# Patient Record
Sex: Female | Born: 1970 | Race: White | Hispanic: No | Marital: Married | Smoking: Current every day smoker
Health system: Southern US, Community
[De-identification: ages and names within clinical notes are randomized; demographics above are authoritative.]

## PROBLEM LIST (undated history)

## (undated) DIAGNOSIS — C801 Malignant (primary) neoplasm, unspecified: Secondary | ICD-10-CM

## (undated) DIAGNOSIS — I10 Essential (primary) hypertension: Secondary | ICD-10-CM

## (undated) DIAGNOSIS — C539 Malignant neoplasm of cervix uteri, unspecified: Secondary | ICD-10-CM

## (undated) HISTORY — DX: Essential (primary) hypertension: I10

## (undated) HISTORY — PX: ABDOMINAL HYSTERECTOMY: SHX81

## (undated) HISTORY — PX: DILATION AND CURETTAGE OF UTERUS: SHX78

---

## 1999-01-30 ENCOUNTER — Other Ambulatory Visit: Admission: RE | Admit: 1999-01-30 | Discharge: 1999-01-30 | Payer: Self-pay | Admitting: Gynecology

## 1999-06-12 ENCOUNTER — Encounter: Admission: RE | Admit: 1999-06-12 | Discharge: 1999-09-10 | Payer: Self-pay | Admitting: Gynecology

## 1999-06-19 ENCOUNTER — Inpatient Hospital Stay (HOSPITAL_COMMUNITY): Admission: AD | Admit: 1999-06-19 | Discharge: 1999-06-19 | Payer: Self-pay | Admitting: Obstetrics and Gynecology

## 1999-07-22 ENCOUNTER — Inpatient Hospital Stay (HOSPITAL_COMMUNITY): Admission: AD | Admit: 1999-07-22 | Discharge: 1999-07-22 | Payer: Self-pay | Admitting: Gynecology

## 1999-07-27 ENCOUNTER — Inpatient Hospital Stay (HOSPITAL_COMMUNITY): Admission: AD | Admit: 1999-07-27 | Discharge: 1999-07-27 | Payer: Self-pay | Admitting: Gynecology

## 1999-08-19 ENCOUNTER — Inpatient Hospital Stay (HOSPITAL_COMMUNITY): Admission: AD | Admit: 1999-08-19 | Discharge: 1999-08-21 | Payer: Self-pay | Admitting: Gynecology

## 1999-08-19 ENCOUNTER — Encounter (INDEPENDENT_AMBULATORY_CARE_PROVIDER_SITE_OTHER): Payer: Self-pay

## 1999-09-27 ENCOUNTER — Other Ambulatory Visit: Admission: RE | Admit: 1999-09-27 | Discharge: 1999-09-27 | Payer: Self-pay | Admitting: Gynecology

## 2000-01-28 ENCOUNTER — Encounter: Payer: Self-pay | Admitting: Emergency Medicine

## 2000-01-28 ENCOUNTER — Emergency Department (HOSPITAL_COMMUNITY): Admission: EM | Admit: 2000-01-28 | Discharge: 2000-01-28 | Payer: Self-pay | Admitting: Emergency Medicine

## 2000-04-28 ENCOUNTER — Inpatient Hospital Stay (HOSPITAL_COMMUNITY): Admission: EM | Admit: 2000-04-28 | Discharge: 2000-04-29 | Payer: Self-pay | Admitting: *Deleted

## 2000-05-04 ENCOUNTER — Inpatient Hospital Stay (HOSPITAL_COMMUNITY): Admission: EM | Admit: 2000-05-04 | Discharge: 2000-05-06 | Payer: Self-pay | Admitting: *Deleted

## 2000-09-03 ENCOUNTER — Emergency Department (HOSPITAL_COMMUNITY): Admission: EM | Admit: 2000-09-03 | Discharge: 2000-09-03 | Payer: Self-pay | Admitting: Emergency Medicine

## 2000-10-29 ENCOUNTER — Other Ambulatory Visit: Admission: RE | Admit: 2000-10-29 | Discharge: 2000-10-29 | Payer: Self-pay | Admitting: Gynecology

## 2000-12-26 ENCOUNTER — Emergency Department (HOSPITAL_COMMUNITY): Admission: EM | Admit: 2000-12-26 | Discharge: 2000-12-26 | Payer: Self-pay | Admitting: Emergency Medicine

## 2000-12-26 ENCOUNTER — Encounter: Payer: Self-pay | Admitting: Emergency Medicine

## 2001-03-27 ENCOUNTER — Emergency Department (HOSPITAL_COMMUNITY): Admission: EM | Admit: 2001-03-27 | Discharge: 2001-03-27 | Payer: Self-pay | Admitting: Emergency Medicine

## 2001-03-28 ENCOUNTER — Emergency Department (HOSPITAL_COMMUNITY): Admission: EM | Admit: 2001-03-28 | Discharge: 2001-03-28 | Payer: Self-pay | Admitting: Emergency Medicine

## 2001-11-26 ENCOUNTER — Ambulatory Visit (HOSPITAL_COMMUNITY): Admission: RE | Admit: 2001-11-26 | Discharge: 2001-11-26 | Payer: Self-pay | Admitting: Internal Medicine

## 2001-11-26 ENCOUNTER — Encounter: Payer: Self-pay | Admitting: Internal Medicine

## 2003-01-23 ENCOUNTER — Other Ambulatory Visit: Admission: RE | Admit: 2003-01-23 | Discharge: 2003-01-23 | Payer: Self-pay | Admitting: Obstetrics & Gynecology

## 2003-01-23 ENCOUNTER — Other Ambulatory Visit: Admission: RE | Admit: 2003-01-23 | Discharge: 2003-01-23 | Payer: Self-pay | Admitting: Obstetrics and Gynecology

## 2004-01-09 ENCOUNTER — Emergency Department (HOSPITAL_COMMUNITY): Admission: EM | Admit: 2004-01-09 | Discharge: 2004-01-09 | Payer: Self-pay | Admitting: Emergency Medicine

## 2004-01-19 ENCOUNTER — Ambulatory Visit (HOSPITAL_COMMUNITY): Admission: RE | Admit: 2004-01-19 | Discharge: 2004-01-19 | Payer: Self-pay | Admitting: *Deleted

## 2004-02-22 ENCOUNTER — Other Ambulatory Visit: Admission: RE | Admit: 2004-02-22 | Discharge: 2004-02-22 | Payer: Self-pay | Admitting: *Deleted

## 2004-03-05 ENCOUNTER — Other Ambulatory Visit: Admission: RE | Admit: 2004-03-05 | Discharge: 2004-03-05 | Payer: Self-pay | Admitting: *Deleted

## 2004-04-02 ENCOUNTER — Ambulatory Visit: Admission: RE | Admit: 2004-04-02 | Discharge: 2004-04-02 | Payer: Self-pay | Admitting: Gynecology

## 2004-04-09 ENCOUNTER — Encounter (INDEPENDENT_AMBULATORY_CARE_PROVIDER_SITE_OTHER): Payer: Self-pay | Admitting: *Deleted

## 2004-04-09 ENCOUNTER — Ambulatory Visit (HOSPITAL_COMMUNITY): Admission: RE | Admit: 2004-04-09 | Discharge: 2004-04-09 | Payer: Self-pay | Admitting: Gynecology

## 2004-05-21 ENCOUNTER — Ambulatory Visit: Admission: RE | Admit: 2004-05-21 | Discharge: 2004-05-21 | Payer: Self-pay | Admitting: Gynecology

## 2004-07-30 ENCOUNTER — Encounter (INDEPENDENT_AMBULATORY_CARE_PROVIDER_SITE_OTHER): Payer: Self-pay | Admitting: *Deleted

## 2004-07-30 ENCOUNTER — Ambulatory Visit: Admission: RE | Admit: 2004-07-30 | Discharge: 2004-07-30 | Payer: Self-pay | Admitting: Gynecologic Oncology

## 2004-08-17 ENCOUNTER — Emergency Department (HOSPITAL_COMMUNITY): Admission: EM | Admit: 2004-08-17 | Discharge: 2004-08-17 | Payer: Self-pay | Admitting: Emergency Medicine

## 2004-08-19 ENCOUNTER — Inpatient Hospital Stay (HOSPITAL_COMMUNITY): Admission: AD | Admit: 2004-08-19 | Discharge: 2004-08-23 | Payer: Self-pay | Admitting: *Deleted

## 2004-09-18 ENCOUNTER — Ambulatory Visit (HOSPITAL_COMMUNITY): Admission: RE | Admit: 2004-09-18 | Discharge: 2004-09-18 | Payer: Self-pay | Admitting: *Deleted

## 2004-10-08 ENCOUNTER — Ambulatory Visit (HOSPITAL_COMMUNITY): Admission: AD | Admit: 2004-10-08 | Discharge: 2004-10-08 | Payer: Self-pay | Admitting: *Deleted

## 2004-11-01 ENCOUNTER — Ambulatory Visit (HOSPITAL_COMMUNITY): Admission: RE | Admit: 2004-11-01 | Discharge: 2004-11-01 | Payer: Self-pay | Admitting: *Deleted

## 2004-12-11 ENCOUNTER — Ambulatory Visit (HOSPITAL_COMMUNITY): Admission: AD | Admit: 2004-12-11 | Discharge: 2004-12-11 | Payer: Self-pay | Admitting: *Deleted

## 2004-12-16 ENCOUNTER — Ambulatory Visit (HOSPITAL_COMMUNITY): Admission: AD | Admit: 2004-12-16 | Discharge: 2004-12-16 | Payer: Self-pay | Admitting: *Deleted

## 2004-12-17 ENCOUNTER — Ambulatory Visit (HOSPITAL_COMMUNITY): Admission: RE | Admit: 2004-12-17 | Discharge: 2004-12-17 | Payer: Self-pay | Admitting: *Deleted

## 2004-12-19 ENCOUNTER — Ambulatory Visit (HOSPITAL_COMMUNITY): Admission: AD | Admit: 2004-12-19 | Discharge: 2004-12-19 | Payer: Self-pay | Admitting: *Deleted

## 2004-12-23 ENCOUNTER — Ambulatory Visit (HOSPITAL_COMMUNITY): Admission: AD | Admit: 2004-12-23 | Discharge: 2004-12-23 | Payer: Self-pay | Admitting: *Deleted

## 2004-12-26 ENCOUNTER — Ambulatory Visit (HOSPITAL_COMMUNITY): Admission: AD | Admit: 2004-12-26 | Discharge: 2004-12-26 | Payer: Self-pay | Admitting: *Deleted

## 2004-12-29 ENCOUNTER — Ambulatory Visit (HOSPITAL_COMMUNITY): Admission: AD | Admit: 2004-12-29 | Discharge: 2004-12-29 | Payer: Self-pay | Admitting: *Deleted

## 2005-02-18 ENCOUNTER — Ambulatory Visit: Admission: RE | Admit: 2005-02-18 | Discharge: 2005-02-18 | Payer: Self-pay | Admitting: Gynecology

## 2005-03-18 ENCOUNTER — Encounter (INDEPENDENT_AMBULATORY_CARE_PROVIDER_SITE_OTHER): Payer: Self-pay | Admitting: Specialist

## 2005-03-18 ENCOUNTER — Inpatient Hospital Stay (HOSPITAL_COMMUNITY): Admission: RE | Admit: 2005-03-18 | Discharge: 2005-03-20 | Payer: Self-pay | Admitting: Obstetrics & Gynecology

## 2005-05-16 ENCOUNTER — Ambulatory Visit: Admission: RE | Admit: 2005-05-16 | Discharge: 2005-05-16 | Payer: Self-pay | Admitting: Gynecology

## 2006-02-03 IMAGING — US US OB COMP LESS 14 WK
1 series · 13 of 28 positions shown · non-contrast
Comparison: No prior studies for comparison.

CLINICAL DATA: Patient is 9 weeks 4 days pregnant based on LMP of 11/03/03.  She has had vaginal bleeding.
 1.  TRANSABDOMINAL OBSTETRIC ULTRASOUND, LESS THAN 14 WEEKS GESTATION:
 2.  TRANSVAGINAL OBSTETRIC ULTRASOUND:

[Series 1: unknown · 0.29mm/px · 13 of 51 slices shown]
[im 2/51]
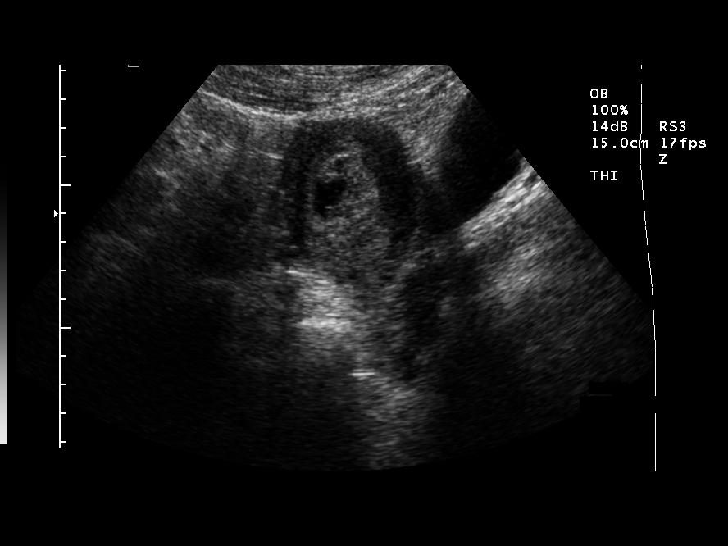
[im 6/51]
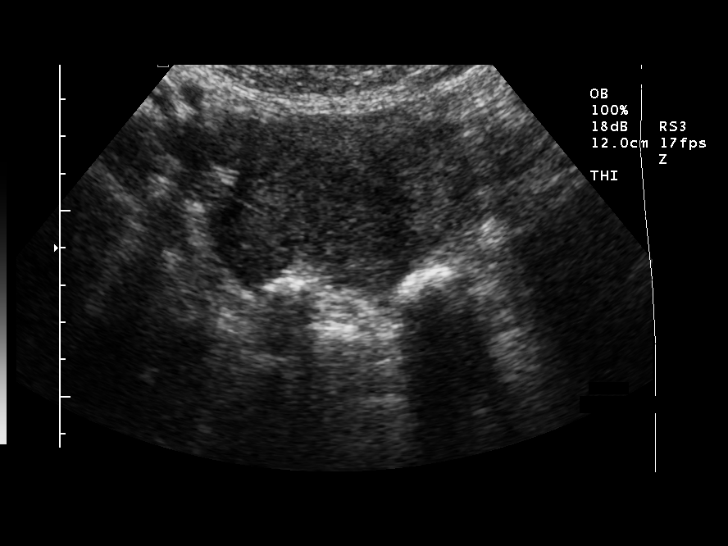
[im 10/51]
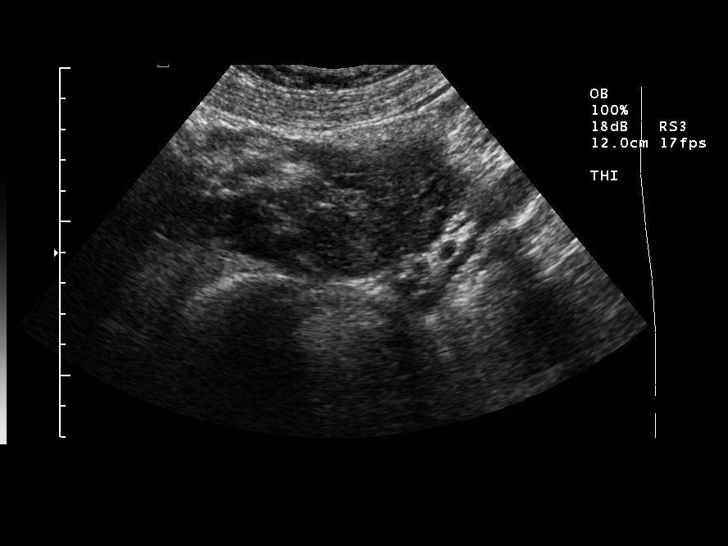
[im 13/51]
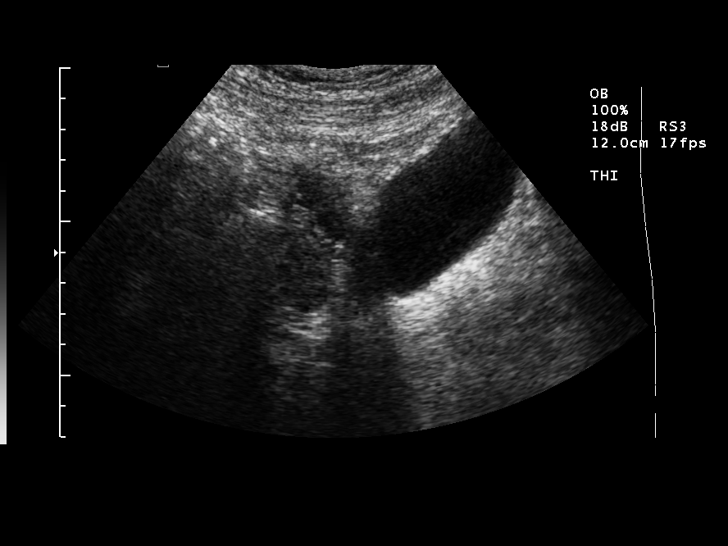
[im 17/51]
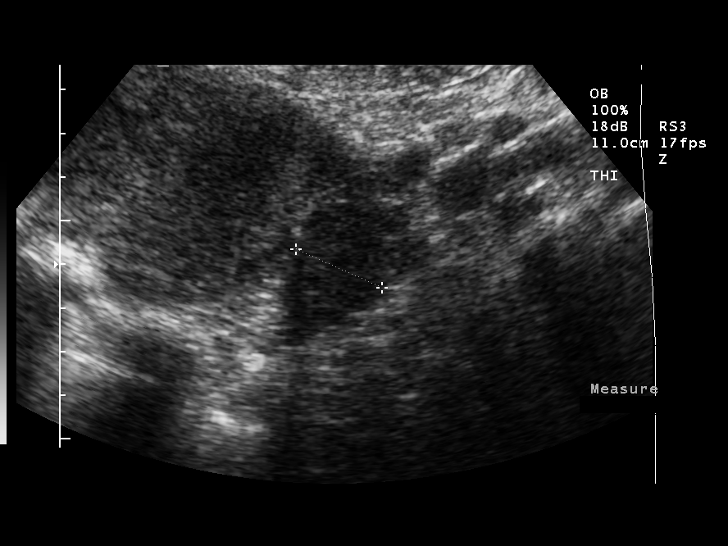
[im 21/51]
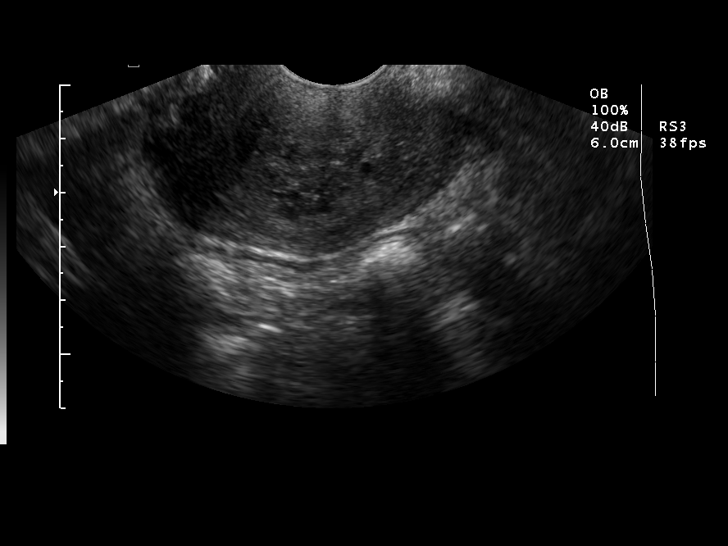
[im 26/51]
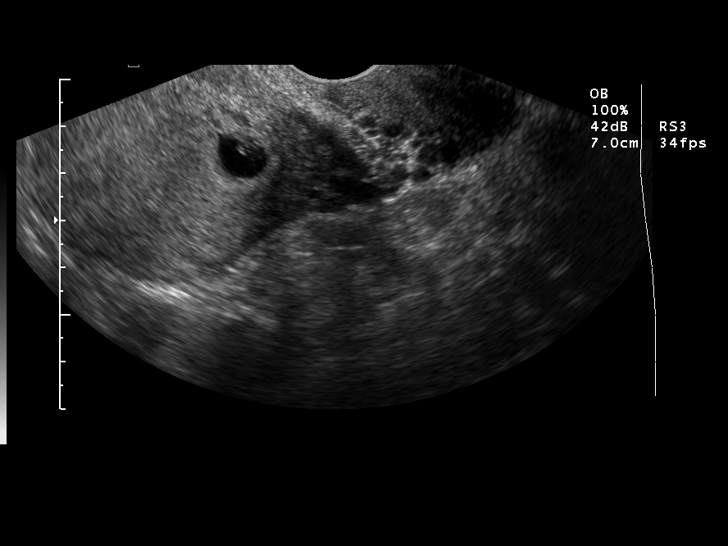
[im 30/51]
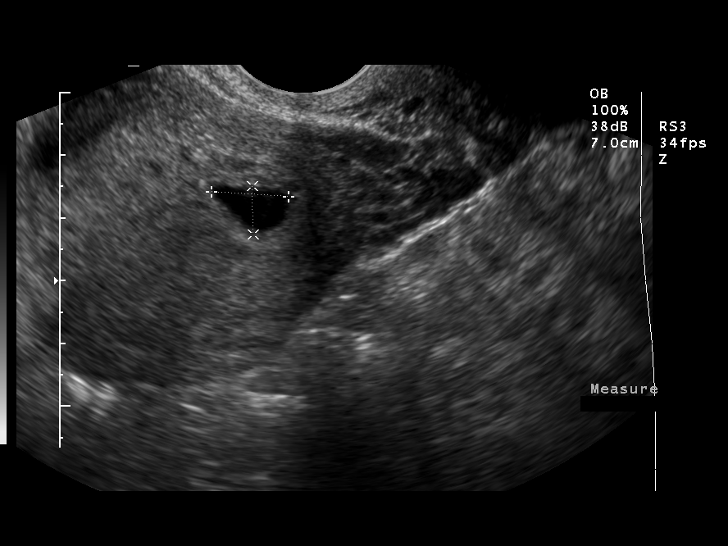
[im 34/51]
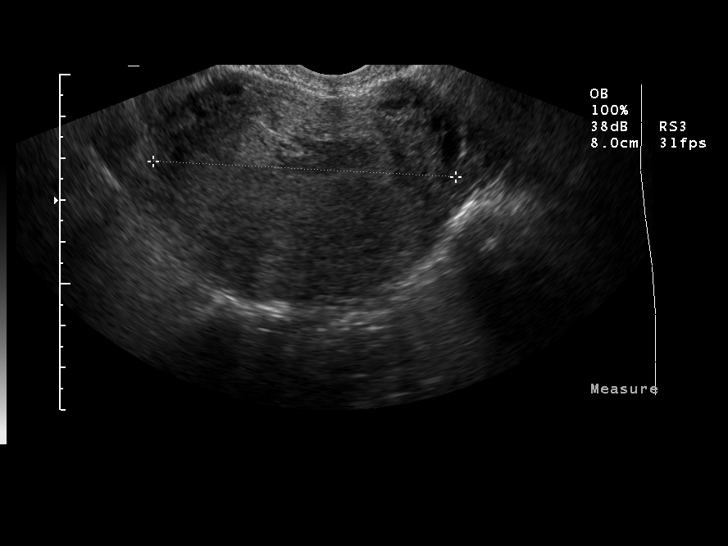
[im 38/51]
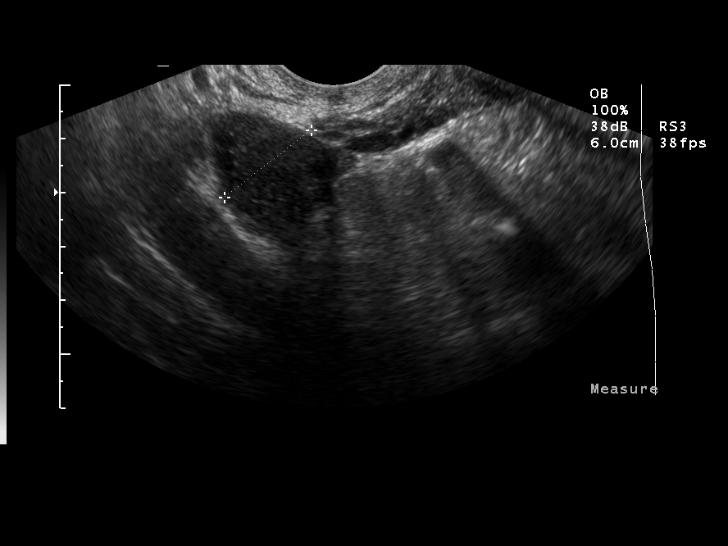
[im 41/51]
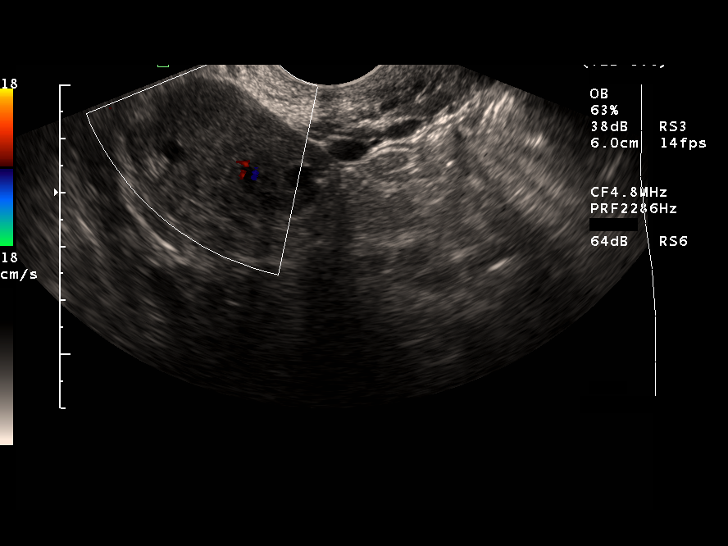
[im 45/51]
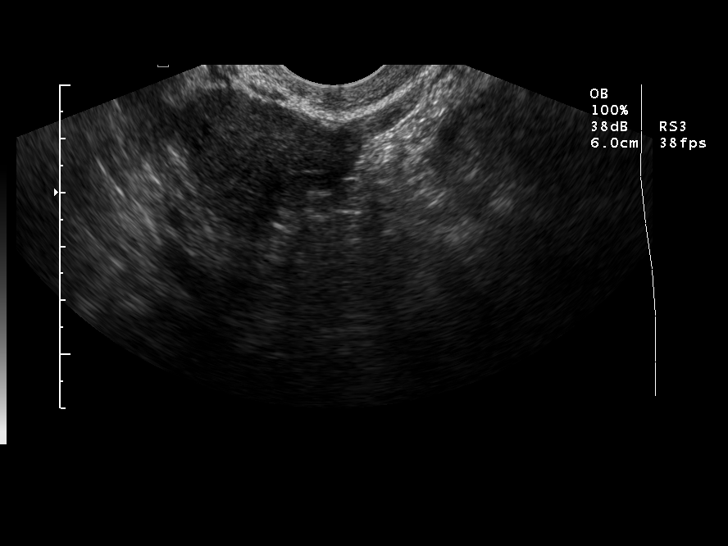
[im 49/51]
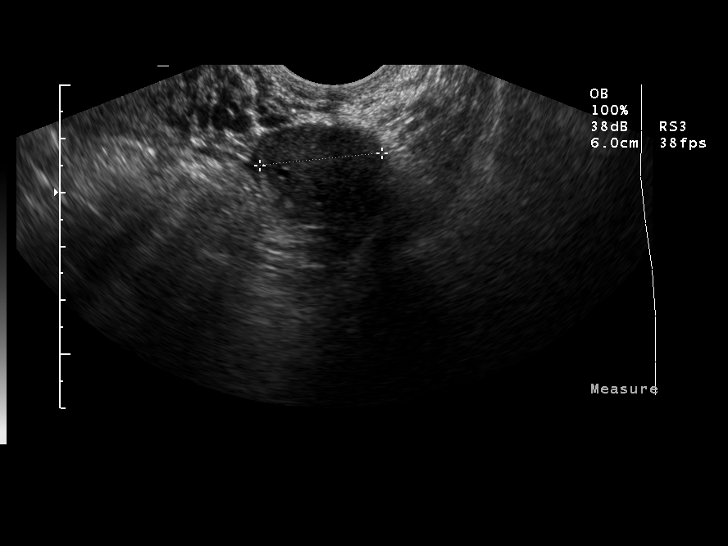

[13 of 28 positions shown; findings below may reference images not displayed]

TRANSABDOMINAL OB ULTRASOUND:
 The uterus contains an ovoid gestational sac.  Both adnexal regions are well visualized on the transabdominal study and demonstrate no abnormalities.  No free fluid was seen on the transabdominal study.
 TRANSVAGINAL OBSTETRIC SONOGRAPHY:
 Uterine dimensions are approximately 9.5 x 4.9 x 7.2 cm.  There is an irregular shaped gestational sac present in the uterus.  Based on estimated mean sac diameter, gestational age by ultrasound is estimated to be 6 weeks and 1 day.  There is some debris along the margin of the sac without a defined fetal pole or visible yolk sac.  No heart rate is detected.  
 The right ovary measures 3.6 x 2 x 2 cm and has a normal appearance.  The left ovary measures 2.3 x 3.1 x 2.3 cm and is normal.  No free fluid is seen.
IMPRESSION: Irregular gestational sac without defined fetal pole or yolk sac.  No fetal heart rate is detected.  Mean sac diameter corresponds to a 6 week gestation.  This is likely not viable.  The ovaries are normal.

## 2007-01-12 IMAGING — US US OB FOLLOW-UP
1 series · 13 of 28 positions shown · non-contrast
Comparison: 11/01/04.

CLINICAL DATA: Twin pregnancy.
 TWIN OBSTETRICAL ULTRASOUND RE-EVALUATION

 A living intrauterine twin gestation is present.  A separating membrane is seen.

[Series 1: unknown · 0.32mm/px · 13 of 32 slices shown]
[im 2/32]
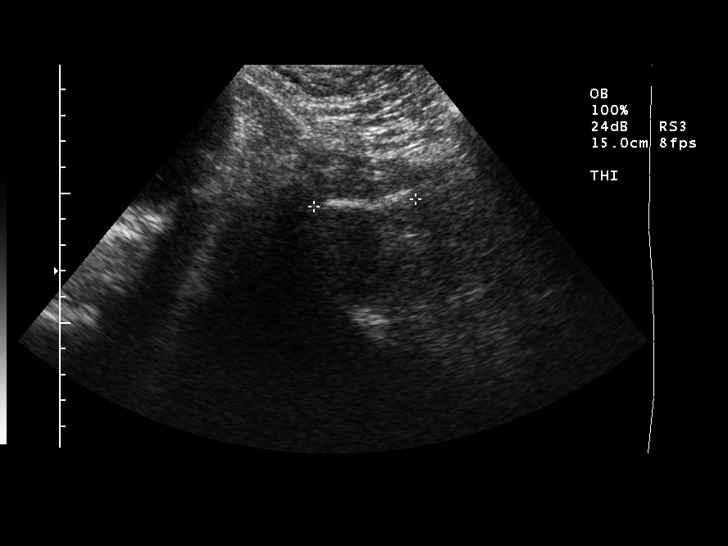
[im 4/32]
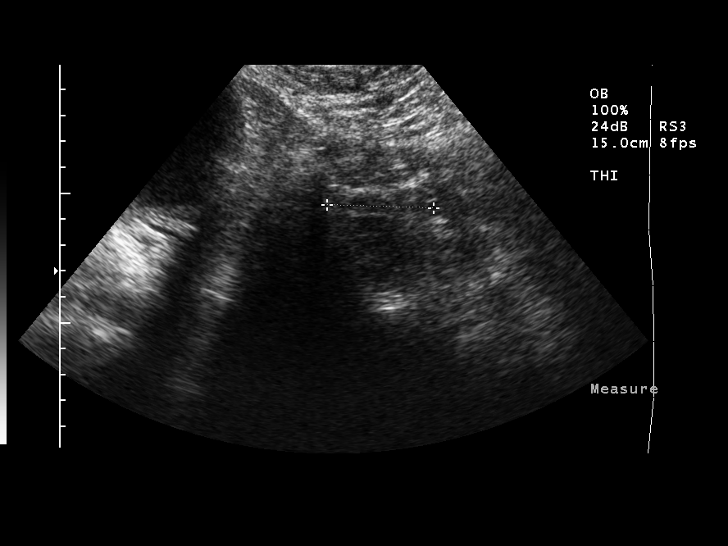
[im 6/32]
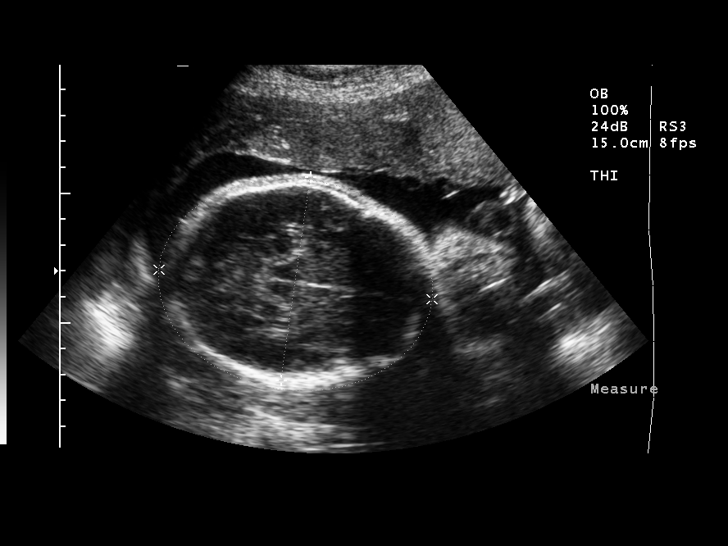
[im 9/32]
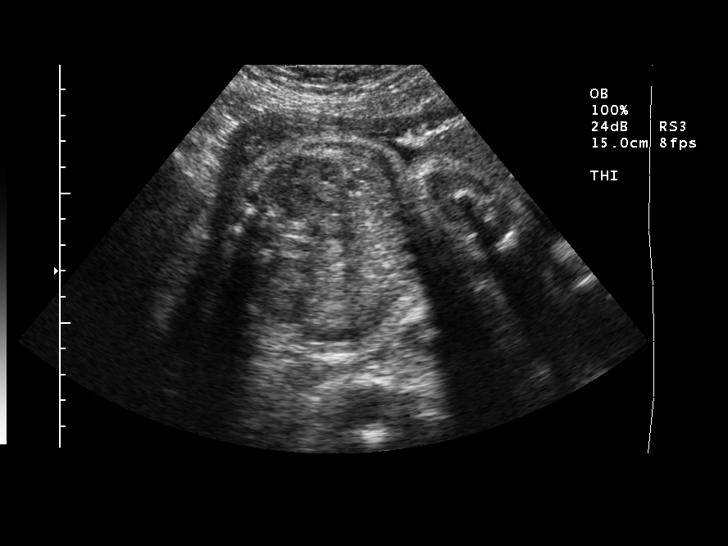
[im 11/32]
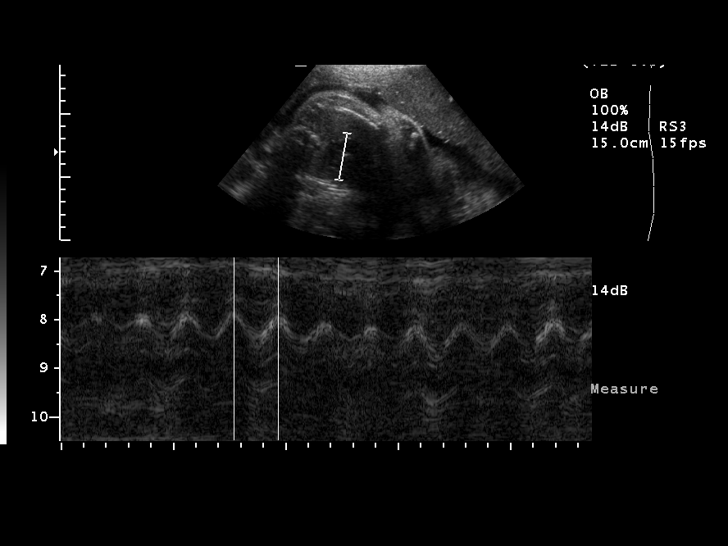
[im 13/32]
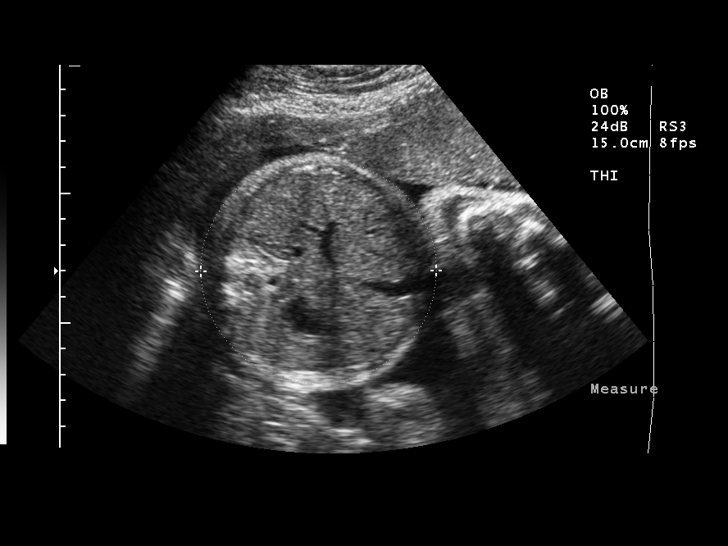
[im 17/32]
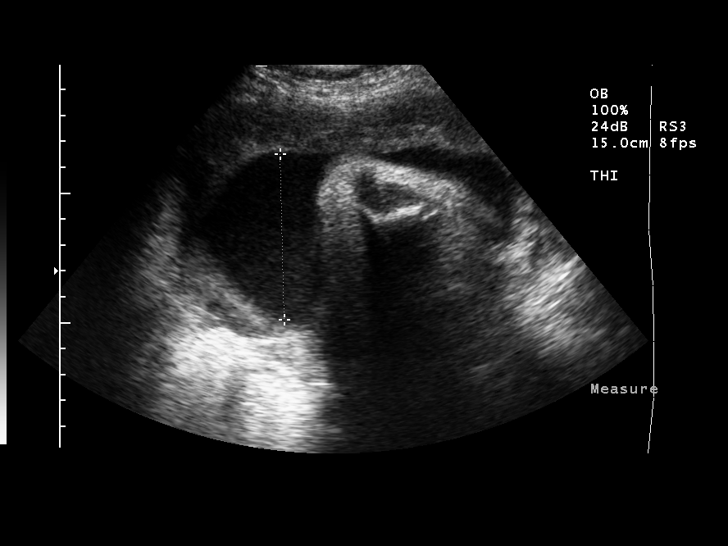
[im 19/32]
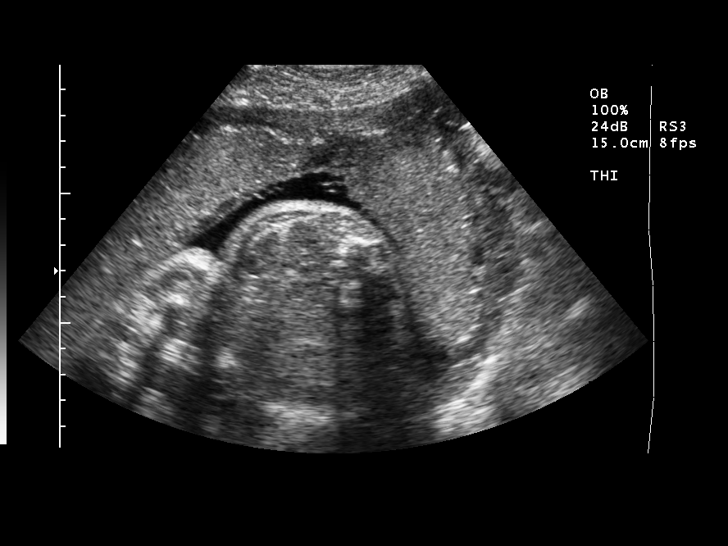
[im 21/32]
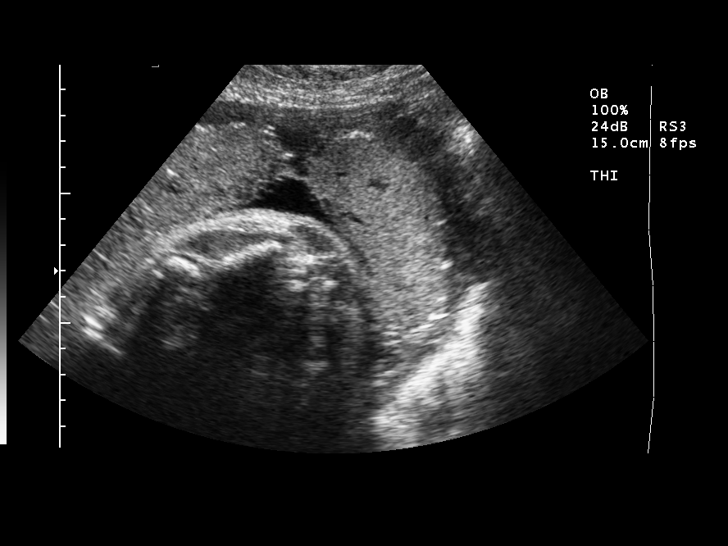
[im 23/32]
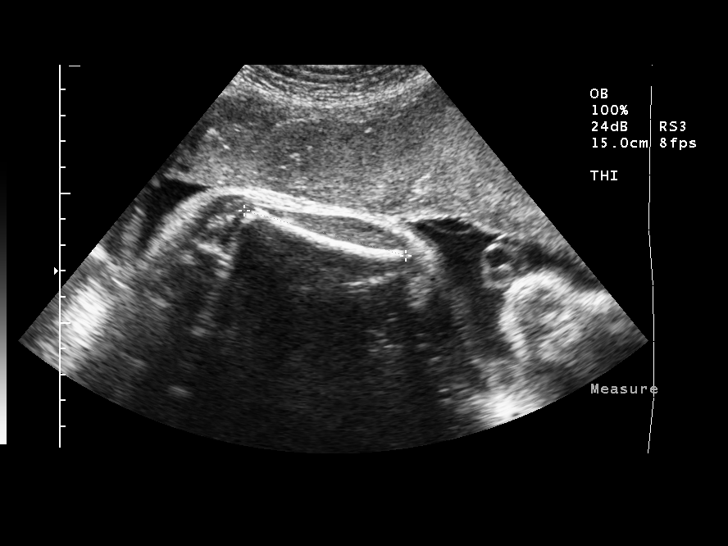
[im 26/32]
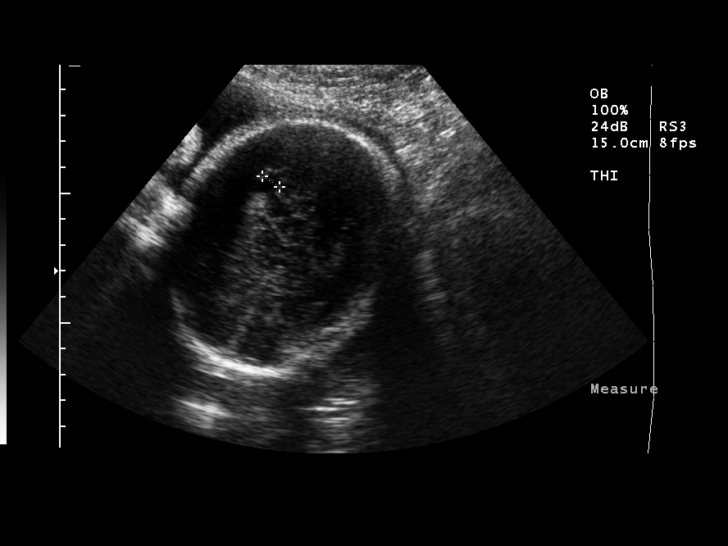
[im 28/32]
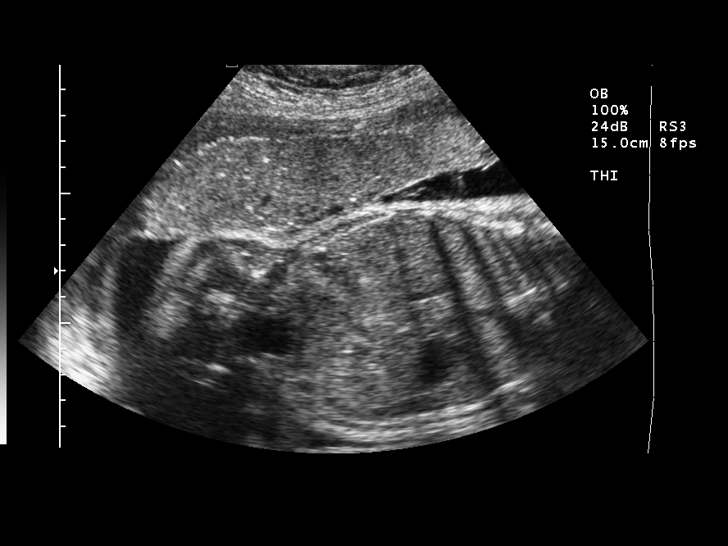
[im 30/32]
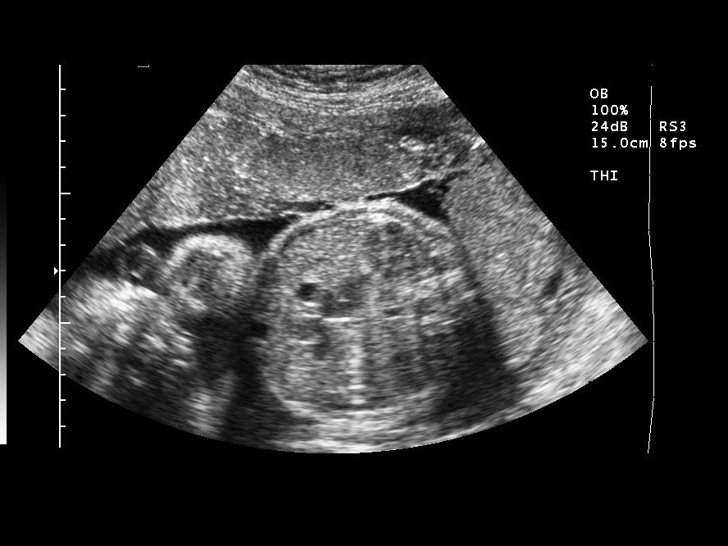

[13 of 28 positions shown; findings below may reference images not displayed]

FETUS A:
 Heart rate:  153
 Movement:  Yes
 Breathing:   Yes
 Presentation:  Breech
 Placental Location:  Anterior
 Placental Grade:  II
 Amniotic Fluid (Subjective):  Normal
 Amniotic Fluid (Objective):  6.4 cm Vertical pocket 
 FETAL BIOMETRY:
 BPD:  7.9 cm   31 w 5 d
 HC:  29.5 cm   32 w 5 d
 AC:  27.7 cm  31 w 6 d
 FL:   6.4 cm  32 w 6 d
 Mean GA:  32 w 2 d

 EFW:  4222 g 75th%ile for 32 weeks

 FETAL ANATOMY (FETUS A):
 Lateral Ventricle:  Visualized   
 Thalami/CSP:  Visualized 
 Posterior Fossa:  Visualized 
 Nuchal Region:  Previously seen 
 Spine:  Previously seen 
 4-Chamber Heart:  Previously seen 
 Stomach:  Visualized 
 3-Vessel Cord:  Previously seen 
 Abd Cord Insert:  Previously seen 
 Kidneys:  Visualized 
 Bladder:  Visualized 
 Extremities:  Previously seen 

 Limited by:  Advanced gestational age 

 FETUS B:
 Heart rate:  140
 Movement:  Yes
 Breathing:   Yes
 Presentation:  Cephalic
 Placental Location:  Anterior
 Placental Grade:  II
 Amniotic Fluid (Subjective):  Normal
 Amniotic Fluid (Objective):  5.7 cm Vertical pocket 
 FETAL BIOMETRY:
 BPD:  8.0 cm   32 w 2 d
 HC:  29.7 cm   32 w 6 d
 AC:  30.2 cm   34 w 1 d
 FL:  6.4 cm   33 w 1 d
 Mean GA:  33 w 1 d

 EFW:  3381 g 90th ? 95th%ile for 32 wks

 FETAL ANATOMY (FETUS B):
 Lateral Ventricle:  Visualized 
 Thalami/CSP:  Visualized 
 Posterior Fossa:  Visualized 
 Nuchal Region:  Previously seen 
 Spine:  Previously seen 
 4-Chamber Heart:  Previously seen 
 Stomach:  Visualized 
 3-Vessel Cord:  Previously seen 
 Abd Cord Insert:  Previously seen 
 Kidneys:  Visualized 
 Bladder:  Visualized 
 Extremities:  Previously seen 

 Limited by:  Advanced gestational age 

 MATERNAL UTERINE AND ADNEXAL FINDINGS
 Cervix:  3.9 cm Transabdominally
IMPRESSION: 1.  Live twin intrauterine gestation.  
 2.  Fetus A on maternal right, breech.
 3.  Fetus B on maternal left, cephalic.  
 4.  Normal amniotic fluid volume.
 5.  Both fetus A and fetus B demonstrate growth since previous study, though fetus B slightly greater growth and slightly higher estimated fetal weight versus fetus A.
 6.  Limited morphology.

## 2007-08-22 ENCOUNTER — Emergency Department (HOSPITAL_COMMUNITY): Admission: EM | Admit: 2007-08-22 | Discharge: 2007-08-22 | Payer: Self-pay | Admitting: Emergency Medicine

## 2008-06-25 ENCOUNTER — Emergency Department (HOSPITAL_COMMUNITY): Admission: EM | Admit: 2008-06-25 | Discharge: 2008-06-25 | Payer: Self-pay | Admitting: Emergency Medicine

## 2009-03-31 ENCOUNTER — Emergency Department (HOSPITAL_COMMUNITY): Admission: EM | Admit: 2009-03-31 | Discharge: 2009-03-31 | Payer: Self-pay | Admitting: Emergency Medicine

## 2009-08-27 ENCOUNTER — Emergency Department (HOSPITAL_COMMUNITY): Admission: EM | Admit: 2009-08-27 | Discharge: 2009-08-27 | Payer: Self-pay | Admitting: Emergency Medicine

## 2010-03-29 ENCOUNTER — Emergency Department (HOSPITAL_COMMUNITY)
Admission: EM | Admit: 2010-03-29 | Discharge: 2010-03-29 | Payer: Self-pay | Source: Home / Self Care | Admitting: Emergency Medicine

## 2010-07-19 NOTE — Op Note (Signed)
Sandra Rivers, LANDRY NO.:  [id number]   MEDICAL RECORD NO.:  [id number]          PATIENT TYPE:  INP   LOCATION:  A427                          FACILITY:  [facility]   PHYSICIAN:  Langley Gauss, MD     DATE OF BIRTH:  [birth year]   DATE OF PROCEDURE:  08/22/2004  DATE OF DISCHARGE:                                 OPERATIVE REPORT   DIAGNOSES:  1.  A 15+ week intrauterine pregnancy.  2.  Twin gestation.  3.  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   PROCEDURE PERFORMED:  Placement of McDonald cerclage utilizing two  circumferential McDonald sutures of #1 Novofil.  Each of these is tied with  a blind loop of extra suture to facilitate later removal.  One knot being  placed at 12 o'clock, the other being placed at 6 o'clock.   ANALGESIA:  Spinal the patient in addition, did receive some IV morphine  sulfate for discomfort associated with the procedure.   ESTIMATED BLOOD LOSS:  Minimal.   FINDINGS:  The surgical procedure was made markedly difficult due to a  multiparous vagina with the cervix noted to be very deep within the vaginal  vault and when retraction was placed anteriorly and posteriorly, there was  noted to be collapsing of the vaginal sidewall, making visualization more  difficult and requiring extensive efforts involved in retraction. Retractors  utilized include a medium length posterior weighted retractor, a long  weighted retractor was used posteriorly. Both small and large Deaver  retractors are utilized. A medium and a large bivalve speculum is utilized.   SUMMARY:  The patient is noted to have a twin gestation. Ultrasound had  revealed the cervix to be 2.2 cm in length. The patient had no complaints of  vaginal bleeding, no leakage of fluid or pelvic clamping.  She was taken to  the operating  room.  Vital signs were stable.  Fetal heart tones were  auscultated in each of the twins.  The patient was placed in a seated  position at which time a spinal analgesic was administered by the anesthesia  staff. The patient was then placed in the dorsal lithotomy position and  prepped and draped in the usual sterile manner. Initially a large bivalve  speculum was not available.  This was requested.  In the meantime, the  medium length speculum was utilized, but due to the very deep vaginal vault,  I was unable to visualize the cervix. The medium weighted speculum was then  utilized.  This did not allow adequate visualization of cervix, so a long  weighted speculum was called for.  Deaver retractors were then utilized to  retract anteriorly and posteriorly, again adequate visualization was not  achieved.  At this point in time, the longer weighted retractor was  obtained. This was placed. This did result in improved visualization of the  cervix and allowed me to initiate the procedure.  It  was  at this point in  time that it became apparent that there was also a collapsing of the vaginal  sidewalls which would limit view, thus larger Deaver retractors are  requested.  Retracting anteriorly, the bladder reflection was identified on  the 12 o'clock position of the cervix.  Efforts were made to retract this so  just cervix was visualized.  The #1 Novofil sutures then placed just distal  to this bladder reflection and then in a counterclockwise manner, a total of  four bites were taken to circumferentially navigate the cervix. This first  suture being 2 cm from the most distal portion of the cervix. This was then  cinched down and tied.  Prior to tying, confirmation was made that complete  circumferential suture had been placed and that the endocervical os was  visualized. To strengthen the repair, a second circumferential suture is  placed. This one, however, was started at the 6 o'clock  position, again  continuing in a counterclockwise fashion, circumferentially navigated the  cervix, placing four bites.  Prior to cinching down, this was again  examined, noted to incorporate the entirety of the cervix such that  endocervical os was visualized as the suture was cinched down, resulting in  additional support to the cervix. The patient did begin having some  discomfort at the end of the procedure due to the one hour operating time.  She did receive IV morphine for pain relief at that time which allowed [place] to  continue and complete the procedure. No leakage of fluid was noted.  No  vaginal bleeding or any rupture of membranes. The patient was able to begin  to move her legs immediately following the procedure. Subsequently, she was  taken to the recovery room in stable condition and was to be transferred to  the fourth floor for continued postoperative care. The patient will be  hospitalized overnight for observation purposes. She is advised that some  mild uterine cramping may occur, but to notify anyone immediately if vaginal  bleeding for history suggestive of ruptured membranes would occur. The  patient expresses understanding.       DC/MEDQ  D:  08/22/2004  T:  08/22/2004  Job:  [id number]

## 2010-07-19 NOTE — Consult Note (Signed)
NAME:  Sandra Rivers, Sandra Rivers NO.:  [id number]   MEDICAL RECORD NO.:  [id number]          PATIENT TYPE:  OUT   LOCATION:  GYN                          FACILITY:  [facility]   PHYSICIAN:  De Blanch, M.D.DATE OF BIRTH:  [birth year]   DATE OF CONSULTATION:  02/18/2005  DATE OF DISCHARGE:  02/18/2005                                   CONSULTATION   A 40 year old white female returns for follow-up of adenocarcinoma of the  cervix.   INTERVAL HISTORY:  The patient had twins delivered by emergency C-section  approximately 6 weeks ago. Both babies spent approximately a week in the  NICU but have been home since then and seem to be doing well. The patient  herself has recovered nicely from her C-section and feels quite well. She  tells me she had a Pap smear done by Dr. Lisette Grinder 3 weeks ago. She has not  had any menstrual periods since the surgery.   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 Final pathology on the LEEP  showed 3 mm of invasion and 4 mm of lateral spread. The conization specimen  had no residual disease. The patient strongly desires preserving fertility  and therefore we followed her and she did become pregnant. At the time of  her C-section, she had her tubes tied and is considering having a  hysterectomy in the future.   PAST MEDICAL HISTORY:  Medical illnesses none.   PAST SURGICAL HISTORY:  D&C, LEEP procedure, cold knife conization, cesarean  section cervical cerclage procedure.   CURRENT MEDICATIONS:  None.   DRUG ALLERGIES:  VICODIN, DARVOCET.   OBSTETRICAL HISTORY:  Gravida 6, para 4.   FAMILY HISTORY:  The patient has a grandmother with colon cancer and  cervical cancer.   SOCIAL HISTORY:  The patient is a housekeeper, smokes one pack per day.   REVIEW OF SYSTEMS:  Otherwise  negative.   PHYSICAL EXAM:  VITAL SIGNS:  Height 5 feet 8, weight 219 pounds, blood  pressure 118/70.  GENERAL:  The patient is a healthy white female in no acute distress.  HEENT:  Negative.  NECK:  Supple without thyromegaly.  ABDOMEN:  Soft, nontender. No mass, organomegaly, ascites or hernias are  noted. Her transverse incision is well-healed.  PELVIC:  EGBUS, vagina, bladder and urethra are normal. Cervix is flush to  the vaginal vault status post conization. No lesions are noted. Bimanual  exam reveals a small cervix. No nodularity or masses are noted. Rectovaginal  exam confirms.   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 The patient has had her tubes tied and is  strongly considering a  hysterectomy. She has number of questions regarding  time of the hysterectomy. I would recommend that she be followed and that  surgery be done sometime in the next year or so. So we might assess pelvic  nodes, I would recommend she undergo a hysterectomy through an abdominal  incision (Pfannenstiel). She will consider all of this but in the meantime  will follow up with Dr. Lisette Grinder and half Pap smears in at least 6 months  intervals.   She will contact us if we can be of any additional service.      De Blanch, M.D.  Electronically Signed     DC/MEDQ  D:  02/18/2005  T:  02/20/2005  Job:  [id number]   cc:   Langley Gauss, MD  Fax: [phone]   Telford Nab, R.N.  [address]  [place], [place] [address]
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
## 2010-07-19 NOTE — H&P (Signed)
Sandra Rivers, ALPERT NO.:  [id number]   MEDICAL RECORD NO.:  [id number]          PATIENT TYPE:  INP   LOCATION:  A427                          FACILITY:  [facility]   PHYSICIAN:  Langley Gauss, MD     DATE OF BIRTH:  07-01-1970   DATE OF ADMISSION:  08/19/2004  DATE OF DISCHARGE:  LH                                HISTORY & PHYSICAL   A 40 year old white female, about 15 weeks intrauterine pregnancy, admitted  with diagnosis of right pyelonephritis. The patient's history is that three  days prior on August 16, 2004 the patient began complaining of some right  lower back pain. Felt this was due to the fact that she has increased her  physical activity on that day and had pushed a lawnmower. On Saturday,  subsequently the pain continued and persisted with minimal change. The  patient did go swimming at which time she began having chills. It is  uncertain whether she had a fever at that time as she did not take her  temperature, but then on August 18, 2004, she did begin to feel febrile and  was noted to have T-max of 103.5. Thus, when she contacted the hospital on  August 19, 2004, it became apparent that she probably had pyelonephritis and  was referred to [facility] as direct admission. Pertinently in her history  is that on August 17, 2004, the patient was seen in [facility] emergency room  at which time she was diagnosed with a urinary tract infection and started  on oral antibiotic. The patient had done well on that with no nausea and  vomiting. She was able to tolerate the p.o. antibiotic, but nevertheless  urinary tract infection has now progressed to right pyelonephritis.   PAST MEDICAL HISTORY:  See previous dictations from patient's prenatal  record. Most pertinently her recent LEEP procedure followed by cold-knife  conization by the oncologist in [place]. The patient was to have a  cerclage placed August 20, 2004; however, with the fever of 103.5, I felt it  was  best to first treat the kidney infection until the flank pain had  resolved and the patient remained afebrile such that following placement of  the cerclage these would make her postoperative condition more difficult to  assess.   PHYSICAL EXAMINATION:  GENERAL:  She is complaining of right flank pain.  Temperature 98.1, blood pressure 125/73, pulse 106, respiratory rate 20.  ABDOMEN:  Soft and nontender. Gravida uterus identified, consistent with 15  to [redacted] week gestation.   LABORATORY DATA:  White count is elevated at 20.3, hemoglobin 10.6,  hematocrit 29.7. Noted some marked left shift with greater than 20% bands.  CBC is obtained on August 19, 2004. She is O positive blood type. I do not see  any results for a urinalysis which should be performed in the emergency room  on August 17, 2004 prior to the diagnosis of urinary tract infection.   ASSESSMENT/PLAN:  The patient now admitted for fluid rehydration therapy as  well as IV antibiotics. We will obtain the urine culture from the emergency  room. The cerclage scheduled for August 28, 2004 has been cancelled but likely  can still be performed during this hospitalization.       DC/MEDQ  D:  08/20/2004  T:  08/20/2004  Job:  [id number]

## 2010-07-19 NOTE — Discharge Summary (Signed)
[facility]  Patient:    Sandra Rivers, Sandra Rivers                       MRN: [id number] Adm. Date:  08/19/99 Disc. Date: 08/21/99 Attending:  Douglass Rivers, M.D. Dictator:   Antony Contras, RNC, [facility]                           Discharge Summary  DISCHARGE DIAGNOSES:          1. Intrauterine pregnancy at 41-2/7 weeks.                               2. Spontaneous onset of labor.  PROCEDURE:                    Vacuum-assisted vaginal delivery of viable infant  over a midline episiotomy with third degree extension.  HISTORY OF PRESENT ILLNESS:   The patient is a 40 year old gravida 4, para 1-0-2-1, with LMP of October 08, 1998, corrected [facility] of August 10, 1999.  Prenatal course was  complicated by cigarette smoking and also history of bacterial vaginosis and urinary tract infection.  PRENATAL LABORATORY DATA:     Blood type O positive, antibody screen negative, rubella immune.  RPR, HBSAG, and HIV nonreactive.  Normal MSAFP.  Negative GBS.   HOSPITAL COURSE:              The patient was scheduled to be admitted for Cervidil for cervical ripening.  She was seen in the office on August 19, 1999, and was found to be 1 cm, 80% effaced, and -2 station.  She began having contractions later that afternoon and was found to be in active labor.  At that time her cervix was 3 to 4 cm, 80 to 90%, and -2 station.  Labor did progress to complete dilatation.  A vacuum-assisted vaginal delivery was performed secondary to OP presentation and  thick meconium.  She delivered of an Apgars 8 and 45 female infant over a midline episiotomy with a third degree extension.  Cord pH was 7.23.  Her postpartum course was uncomplicated, she remained afebrile, had no difficulty voiding, and was able to be discharged with her infant on her second postpartum day.  Postpartum CBC; hematocrit 36, hemoglobin 12, WBC 19.9, and platelets 294.  DISPOSITION:                  Follow up in the  office in six weeks.  Continue prenatal vitamins and iron.  Motrin and Tylox for pain. DD:  09/13/99 TD:  09/13/99 Job: [id number]
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
## 2010-07-19 NOTE — Consult Note (Signed)
NAME:  [name], [name] NO.:  [id number]   MEDICAL RECORD NO.:  [id number]          PATIENT TYPE:  OUT   LOCATION:  GYN                          FACILITY:  [facility]   PHYSICIAN:  Paola A. Duard Brady, MD    DATE OF BIRTH:  1970-11-04   DATE OF CONSULTATION:  07/30/2004  DATE OF DISCHARGE:                                   CONSULTATION   HISTORY OF PRESENT ILLNESS:  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  Prior to the conization she had a LEEP that showed an adenocarcinoma of the  cervix invading 4 mm with positive surgical margin. Final pathology on the  cold knife conization specimen showed focal low-grade dysplasia but no  residual cervical adenocarcinoma. ECC was also negative. She comes in today  for her first Pap smear after her conization. She is currently pregnant at  approximately 12 weeks. In November 2005 she did have a miscarriage. She is  being seen by Dr. Lisette Grinder on a regular basis. She is tentatively scheduled  to have a cerclage June 15 but we needed to ensure normal Pap smear prior to  that time. She wanted [place] to do her GC and chlamydia testing today. She is  overall feeling well. She denies any cramping or bleeding.   PHYSICAL EXAMINATION:  VITAL SIGNS:  Weight 211 pounds.  GENERAL:  Well-nourished, well-developed female in no acute distress.  PELVIC:  External genitalia is within normal limits. The vagina is well  epithelialized. The cervix is foreshortened post cone. The cervix is  markedly foreshortened. A ThinPrep Pap was submitted without difficulty. On  bimanual examination, the corpus is globular and enlarged. The cervix is  markedly foreshortened. The os is closed.   ASSESSMENT:  A 41 year old with microinvasive adenocarcinoma of the cervix  who is [redacted] weeks pregnant.   PLAN:  Will follow up results of her Pap smear from today. We performed a GC  and chlamydia  testing today. Will follow up on those results and notify her  of the results. She will follow up with Dr. Lisette Grinder as scheduled and she  would need a repeat Pap smear in approximately 4 months.      PAG/MEDQ  D:  07/30/2004  T:  07/30/2004  Job:  [id number]   cc:   Langley Gauss, MD  [address]  [place]  [place] [address]  Fax: [phone]   Telford Nab, R.N.  [address]  [place], [place] [address]

## 2010-07-19 NOTE — Op Note (Signed)
NAME:  Sandra Rivers, Sandra Rivers NO.:  [id number]   MEDICAL RECORD NO.:  [id number]          PATIENT TYPE:  AMB   LOCATION:  DAY                          FACILITY:  [facility]   PHYSICIAN:  De Blanch, M.D.DATE OF BIRTH:  [birth year]   DATE OF PROCEDURE:  04/09/2004  DATE OF DISCHARGE:                                 OPERATIVE REPORT   PREOPERATIVE DIAGNOSIS:  Microinvasive adenocarcinoma of the cervix with  positive margin.   POSTOPERATIVE DIAGNOSIS:  Microinvasive adenocarcinoma of the cervix with  positive margin.   OPERATION PERFORMED:  Cold knife conization, endocervical curettage.   SURGEON:  De Blanch, M.D.   ASSISTANT:  Telford Nab, R.N.   ANESTHESIA:  LMA.   ESTIMATED BLOOD LOSS:  5 mL.   FINDINGS:  Examination under anesthesia revealed a well-healed cervix.  The  uterus sounded anteriorly approximately 7 cm.  There were no gross lesions.   DESCRIPTION OF PROCEDURE:  The patient was brought to the operating room and  after satisfactory attainment of general anesthesia, was placed in lithotomy  position in candy cane stirrups.  The vulva and perineum were prepped with  Betadine.  The patient was draped.  Examination under anesthesia was  performed.  A weighted speculum was placed in the posterior vagina.  Deaver  elevated the bladder and the cervix was grasped with a single toothed  tenaculum.  0 Vicryl sutures were placed at 8 and 4 o'clock on the cervix to  occlude the cervical branches of the uterine artery.  The cervix was then  injected with approximately 15 mL of 1% Xylocaine with epinephrine.  Circumferential incision was made in the exocervix and a cold knife  conization was performed using a #11 blade on a scalpel.  Endocervical  curettage was performed and submitted separately.  The cone bed was then  cauterized with electrocautery.  Hemostasis was excellent.   The retractors were removed, the patient was placed in supine  position and  transferred to the recovery room in satisfactory condition.  Sponge, needle  and instrument counts were correct times two.      DC/MEDQ  D:  04/09/2004  T:  04/09/2004  Job:  [id number]   cc:   Telford Nab, R.N.  [address]  [place], [place] [address]   Langley Gauss, MD  [address]  [place]  [place] [address]  Fax: [phone]
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
## 2010-07-19 NOTE — Op Note (Signed)
NAMESUKANYA, Sandra Rivers              ACCOUNT NO.:  [id number]   MEDICAL RECORD NO.:  [id number]          PATIENT TYPE:  INP   LOCATION:  X008                         FACILITY:  [facility]   PHYSICIAN:  Paola A. Duard Brady, MD    DATE OF BIRTH:  21-Jun-1970   DATE OF PROCEDURE:  03/18/2005  DATE OF DISCHARGE:                                 OPERATIVE REPORT   PREOPERATIVE DIAGNOSIS:  Stage IA1 cervical adenocarcinoma.   POSTOPERATIVE DIAGNOSIS:  Stage IA1 cervical adenocarcinoma.   PROCEDURE:  Total abdominal hysterectomy.   SURGEON:  Paola A. Duard Brady, MD.   ASSISTANT:  Roseanna Rainbow, MD and Telford Nab, RN.   ANESTHESIOLOGIST:  Quentin Cornwall. Council Mechanic, MD.   ANESTHESIA:  General.   SPECIMEN:  Cervix and uterus.   ESTIMATED BLOOD LOSS:  125 mL.   IV FLUIDS:  2100 mL.   URINE OUTPUT:  75 mL.   COMPLICATIONS:  None.   FINDINGS:  Normal-appearing uterus, tubes and ovaries bilaterally. The  cervix was somewhat foreshortened. There adhesive disease of the omentum to  the anterior abdominal wall.   The patient was taken to the operating room, placed in the dorsolithotomy  position. General anesthesia was introduced without difficulty. The abdomen  was prepped in usual sterile fashion as was the perineum. A Foley catheter  was inserted into the bladder under sterile conditions. She was then draped  in the usual sterile fashion. Time-out was performed.   A transverse Pfannenstiel skin incision was made through the patient's prior  scar with the knife and carried down to underlying fascia sharply. The  fascia was identified, scored in the midline. The fascial incisions were  extended laterally using Mayo scissors. Superiorly the fascia was grasped  with Kocher clamps and  the underlying rectus bellies were dissected off using Bovie cautery. A  similar procedure was performed inferiorly. The rectus bellies were divided  in the midline. The peritoneal incision was extended superiorly  and  inferiorly with visualization of the underlying peritoneal cavity. The  bladder was identified and was noted to be inferior to the area of  dissection. The Bookwalter self-retaining retractor was then placed. The  lateral blades were placed with appropriate precautions protecting the psoas  at all times. The uterus and the findings as above were noted. The uterus  was grasped with Kelly's. The round ligament on the patient's left side was  transected using Bovie cautery. The anterior and posterior leafs of the  broad ligament  were opened and a bladder flap was created. A window was  created under the mesovarian sparing the ovaries. The utero-ovarian vessels  were clamped, transected and ligated with #0 Vicryl x2. The bladder flap was  continued below the level of the cervix. The uterine artery was skeletonized  on the patient's left side. A similar procedure was performed on the  patient's right side. After the uterine's were skeletonized bilaterally,  they were clamped with curved Masterson's, transected and suture ligated  with #0 Vicryl. We continued down the cardinal ligaments in a similar  fashion using hystoclamps, transecting the pedicle and suture ligating it  with #0 Vicryl. This continued down to the cervicovaginal junction. We came  across the vagina with curved Masterson's. The specimen was sent and handed  off. Frozen section revealed no gross abnormalities, deferred to permanent.  The cervicovaginal angles were suture ligated with #0 Vicryl in a figure-of-  eight fashion. The remainder of the cuff was closed using figure-of-eight #0  Vicryl. The area of the pedicles were noted to be hemostatic.   Our attention was then drawn to the left pelvic sidewall. The paravesical  spaces were opened. The lymph nodes were palpated and there was no obvious  abnormalities. Two hemoclips were placed on the IP on the patient's left  side to better identify the ovaries in the future. At  this point there was a  small line of bleeding noted from the IP. A right ankle was placed across  the right across the IP on the patient's left side and  it was reinforced  with #0 Vicryl. The hemostasis was adequate. A similar procedure was noted  performed on the patient's right side. There is no evidence of any  lymphadenopathy visually or by palpation. The abdomen and pelvis were  copiously irrigated. There was a small amount of bleeding on the vaginal  angle on the patient's left side. This was made hemostatic with a #0 Vicryl  figure-of-eight suture. Again the abdomen was copiously irrigated. All  pedicles were identified and noted to be hemostatic.   The Bookwalter was then removed. Two laparotomy sponges were removed. The  omentum was brought down. The fascia was closed using a running #0 Vicryl  x2. The subcu tissues were irrigated, made hemostatic using Bovie cautery.  The skin was closed using skin clips and an air strip dressing was applied.  The patient tolerated the procedure well. All instrument, needle, laparotomy  and RayTec counts were correct x2.      Paola A. Duard Brady, MD  Electronically Signed     PAG/MEDQ  D:  03/18/2005  T:  03/18/2005  Job:  [id number]   cc:   Telford Nab, R.N.  [address]  [place], [place] [address]   Langley Gauss, MD  Fax: [phone]   Roseanna Rainbow, M.D.  Fax: [phone]

## 2010-07-19 NOTE — Consult Note (Signed)
NAME:  Sandra Rivers, Sandra Rivers NO.:  [id number]   MEDICAL RECORD NO.:  [id number]          PATIENT TYPE:  OUT   LOCATION:  GYN                          FACILITY:  [facility]   PHYSICIAN:  De Blanch, M.D.DATE OF BIRTH:  01-13-1971   DATE OF CONSULTATION:  05/21/2004  DATE OF DISCHARGE:                                   CONSULTATION   A 40 year old white female returns for initial postoperative follow-up,  having undergone a cold-knife conization on February 7.  Prior to the  conization, she had had a LEEP procedure which showed an adenocarcinoma of  the cervix invading 4 mm with a positive surgical margin.  Final pathology  on the cold-knife conization specimen showed a focal low-grade squamous  dysplasia but no residual endocervical adenocarcinoma.  Endocervical  curettage was also negative.  The patient has had an uncomplicated  postoperative course.  She strongly desires to preserve her fertility.   PHYSICAL EXAMINATION:  VITAL SIGNS:  Weight 196 pounds, blood pressure  104/64.  ABDOMEN:  Soft, nontender.  No masses, organomegaly, ascites, or hernias are  noted.  PELVIC EXAM:  EG/BUS, vagina, bladder, urethra are normal.  Cervix is status  post cold-knife conization, is healing well.  No lesions are noted.  Bimanual exam reveals a small uterus, anterior, normal shape, size, and  consistency.  No adnexal masses are noted.   IMPRESSION:  Microinvasive adenocarcinoma of the cervix, status post LEEP  and subsequently cold-knife conization with no residual disease.  Because of  the patient's strong desire to preserve fertility, we will manage her  conservatively.  She will be scheduled to have a repeat Pap smear in 3  months.      DC/MEDQ  D:  05/21/2004  T:  05/21/2004  Job:  [id number]   cc:   Langley Gauss, MD  [address]  [place]  [place] [address]  Fax: [phone]   Telford Nab, R.N.  [address]  [place], [place] [address]

## 2010-07-19 NOTE — Op Note (Signed)
NAME:  Sandra Rivers, Sandra Rivers NO.:  [id number]   MEDICAL RECORD NO.:  [id number]          PATIENT TYPE:  AMB   LOCATION:  DAY                           FACILITY:  [facility]   PHYSICIAN:  Langley Gauss, MD     DATE OF BIRTH:  02-Feb-1971   DATE OF PROCEDURE:  01/19/2004  DATE OF DISCHARGE:                                 OPERATIVE REPORT   PREOPERATIVE DIAGNOSIS:  Spontaneous incomplete abortion, 10-12 weeks'  gestation.   POSTOPERATIVE DIAGNOSIS:  Spontaneous incomplete abortion, 10-12 weeks'  gestation.   PROCEDURE PERFORMED:  Suction, followed by curettage of obvious products of  conception.   SURGEON:  Langley Gauss, MD   ESTIMATED BLOOD LOSS:  Less than 100 mL.   SPECIMENS:  Obvious products of conception to laboratory.   COMPLICATIONS:  None.   FINDINGS AT SURGERY:  Products of conception immediately visible at the  endocervical os.  No dilatation is required.   ANESTHESIA:  General with oral airway maintained.   SUMMARY:  The patient was an add-on, thus was done late p.m. of January 19, 2004.  She is NPO status.  Operative plan discussed with the patient and  Reuel Boom immediately prior to going back to the OR. In the operating room,  vital signs were stable.  The patient underwent induction of general  anesthesia.  She was then placed in the dorsal lithotomy position and  prepped and draped in the usual sterile manner.  A sterile speculum  examination is performed.  The products of conception are visible at the  endocervical os.  They are grasped and removed in a piecemeal fashion  utilizing ring forceps.  Sterile ring forceps are then used to perform  intrauterine exploration to remove additional products of conception.  A #8  suction catheter tip is then gently inserted through the cervix up to the  uterine fundus.  Suction is then applied and with slow circular withdrawing  motion, the entire uterine cavity is suctioned with a small amount of  additional  tissue obtained.  Fine banjo curettage is then performed with no  additional tissue obtained.  Bimanual massage of the uterus is performed,  which reveals it to be 10 weeks' size,  normal in contour.  No adnexal mass is appreciated.  Minimal vaginal  bleeding occurring following the procedure.  The patient is thus reversed of  anesthesia, taken to the recovery room in stable condition, of which  operative findings to be discussed with the awaiting family.     Vira Blanco   DC/MEDQ  D:  01/19/2004  T:  01/20/2004  Job:  [id number]

## 2010-07-19 NOTE — Group Therapy Note (Signed)
NAMEDE, LIBMAN NO.:  [id number]   MEDICAL RECORD NO.:  [id number]          PATIENT TYPE:  INP   LOCATION:  A427                          FACILITY:  [facility]   PHYSICIAN:  Langley Gauss, MD     DATE OF BIRTH:  [birth year]   DATE OF PROCEDURE:  08/20/2004  DATE OF DISCHARGE:                                   PROGRESS NOTE   SUBJECTIVE:  The patient is up ambulatory. She continues to complain of  right flank pain, but it has diminished somewhat since initial presentation.   OBJECTIVE:  VITAL SIGNS:  Temperature 98.5, 114/64, pulse 111, respiratory  rate 20.  HEENT:  Negative. No adenopathy.  NECK:  Supple. Thyroid was not palpable.  LUNGS:  Clear.  CARDIOVASCULAR:  Regular rate and rhythm.  ABDOMEN:  Soft and nontender. Fundal height is consistent with [redacted] weeks  gestation. Fetal heart tones were consulted in the 160s per the nursing  staff.  EXTREMITIES:  Noted to be normal.   Urine output 300 cc over the last 8 hours. On examination, is noted to have  still pretty impressive right flank pain.   ASSESSMENT/PLAN:  The patient with 24 hours of IV Rocephin on board. Plan on  doing an OB ultrasound for August 21, 2004 during this inpatient status at  which time the cervical length can be measured. Subsequently, I will try to  have the cerclage scheduled for August 22, 2004 if her clinical condition  continues to improve.       DC/MEDQ  D:  08/20/2004  T:  08/20/2004  Job:  [id number]

## 2010-07-19 NOTE — Consult Note (Signed)
NAME:  Sandra Rivers, Sandra Rivers NO.:  [id number]   MEDICAL RECORD NO.:  [id number]          PATIENT TYPE:  OUT   LOCATION:  GYN                          FACILITY:  [facility]   PHYSICIAN:  De Blanch, M.D.DATE OF BIRTH:  01-10-1971   DATE OF CONSULTATION:  05/16/2005  DATE OF DISCHARGE:  05/16/2005                                   CONSULTATION   CHIEF COMPLAINT:  Postoperative follow-up cervical cancer.   The patient underwent a total abdominal hysterectomy on March 18, 2005 for  completion of therapy for a stage IA1 adenocarcinoma of the cervix. Final  pathology on the hysterectomy specimen showed only low grade dysplasia with  negative margins and no residual cancer. The patient previously had a LEEP  and a cone procedure and was managed conservatively. She ultimately had twin  daughters and then elected to have a total abdominal hysterectomy for  completion of therapy. She has had an uncomplicated postoperative course.  She denies any GI or GU symptoms, has no pelvic pain, pressure, vaginal  bleeding or discharge. Her functional status is excellent.   PHYSICAL EXAM:  VITAL SIGNS:  Weight 223 pounds.  ABDOMEN:  The abdomen is soft, nontender. No mass, organomegaly, ascites or  hernias are noted. The Pfannenstiel incision is well-healed.  PELVIC:  EGBUS, vagina, bladder and urethra are normal. Vaginal cuff is well-  healed, well supported. Bimanual and rectovaginal exam reveal no mass,  induration, nodularity.   IMPRESSION:  Stage IA1 adenocarcinoma of the cervix status post LEEP and  conization followed by total abdominal hysterectomy.   PLAN:  I would recommend the patient be followed by her primary  gynecologist, Dr. Lisette Grinder, in [place] at 83-month interval with Pap smears  and exam.      De Blanch, M.D.  Electronically Signed     DC/MEDQ  D:  05/16/2005  T:  05/19/2005  Job:  [id number]   cc:   Langley Gauss, MD  Fax: [phone]   Telford Nab, R.N.  [address]  [place], [place] [address]

## 2010-07-19 NOTE — H&P (Signed)
NAME:  Sandra Rivers, Sandra Rivers NO.:  [id number]   MEDICAL RECORD NO.:  [id number]          PATIENT TYPE:  AMB   LOCATION:                                FACILITY:  [facility]   PHYSICIAN:  Langley Gauss, MD     DATE OF BIRTH:  [birth year]   DATE OF ADMISSION:  01/03/2004  DATE OF DISCHARGE:  LH                                HISTORY & PHYSICAL   OFFICE CHART NUMBER:  [id number]   HISTORY OF PRESENT ILLNESS:  This is a 40 year old, gravida 5, para 2, with  2 prior vaginal deliveries, and a certain last menstrual period of August 08, 2004.  In addition, positive pregnancy test at [redacted] weeks gestation.  Both of  these would place her currently at an estimated gestational age of [redacted] weeks.  The patient comes in today for continuing prenatal care.  Initially, the  patient did have some right lower quadrant pain.  This would be her third  ultrasound.  Ultrasound one week ago did reveal a yolk sac and a poorly-  visualized fetal pole with fetal cardiac activity identified at 66.  The  patient denies any vaginal bleeding, any cramping at present.  She has  experienced decreased breast tenderness since the first clinical recognition  of the pregnancy.  She has good appetite.  She denies any nausea.  She did  not experience any significant fatigue.  The patient states that she had  quit consumption of all alcohol-containing products prior to the onset of  this pregnancy, and she denies any other drug or medication usage.   PAST MEDICAL HISTORY:   ALLERGIES:  1.  VICODIN causes her to itch.  2.  DARVOCET causes her to itch.   CURRENT MEDICATIONS:  Prenatal vitamins.   PAST MEDICAL HISTORY:  November 1996 - vaginal delivery of a 9 pound, 7  ounce infant.  June 2001 - vaginal delivery of a 9 pound, 11 ounce infant.  Both of these had a nuchal cord.  She did receive epidural analgesic with  each of these.  She has had normal glucose tolerance tests with each of  these pregnancies.  Otherwise, has  no known medical or surgical history.   SOCIAL HISTORY:  The patient does smoke one pack per day.  She is employed  in [place] at the [employer].  Father of the baby is Reuel Boom.  He  works at [employer].  Reuel Boom is Research scientist (physical sciences) Myrick's sister, who has recently  been diagnosed with ulcerative colitis.   REVIEW OF SYSTEMS:  Pertinent for 21 pounds of weight gain.  No breast  tenderness.  No nausea.  No cramping.  No spotting.  No complaints of  constipation or diarrhea.   PHYSICAL EXAMINATION:  GENERAL:  She is in no acute distress.  VITAL SIGNS:  Height 5' 8.  Pregnancy weight 165; most recent weight 186.5.  Blood pressure 126/75, pulse rate of 80, respiratory rate 20.  HEENT:  Negative.  No adenopathy.  NECK:  Supple.  Thyroid is nonpalpable.  LUNGS:  Clear.  CARDIOVASCULAR:  Regular rate and  rhythm.  ABDOMEN:  Soft and nontender.  No surgical scars were identified.  No  abdominal enlargement or uterine enlargement is identified.  EXTREMITIES:  Noted to be normal.  PELVIC:  Normal external genitalia.  No lesions or ulcerations identified.  No vaginal bleeding.  No leakage of fluid.   Transvaginal ultrasound is performed, which does reveal an intrauterine  pregnancy.  Gestational sac is noted and measured a maximum diameter of 1.29  cm.  This is a decrease from maximum sac diameter one week previously of  1.56 cm.  The yolk sac is seen.  A poorly-defined fetal pole is noted with  crown-rump length 6 weeks, 0 days.  No fetal cardiac activity is identified.  No free fluid is identified within the pelvis.  Ovaries are not visualized.   LABORATORY STUDIES:  No laboratories obtained to date on this patient.  She  does state, however, that she is O negative blood type, but she does not  give good recollection of previous treatment with RhoGAM.  Excellent dating  criteria this pregnancy, placing her at [redacted] weeks gestation.  Fetal  development per serial ultrasounds have revealed  crown-rump length of 6  weeks only with no fetal cardiac activity identified.  Clearly abnormal  intrauterine pregnancy.   I discussed this with the patient on today's visit.  I also discussed with  her the diagnosis of missed abortion, and the inevitability of onset of  vaginal bleeding and cramping with resultant miscarriage.  Offered the  patient scheduling of a D&C when she is ready.  The patient, however, states  that she has previously lost a baby, she describes at 5 months gestation,  when she was 40 years old.  She does not have any recollection regarding  absolute dating criteria during that pregnancy.  She does remember, however,  that no surgical procedure nor was a D&C performed.  At this point in time,  she is not prepared for scheduling of the [facility] on an outpatient basis.  The  patient states that she would prefer to have a return visit in 2 weeks time  for a repeat ultrasound.  Certainly, in the meantime, I have advised her she  can contact us and be seen sooner should onset of vaginal bleeding or  significant cramping occur.  On today's date CBC, prenatal profile I is  requested, which will confirm her O negative blood type status.  Thus, at  present, date of scheduled D&C is indeterminate.     Vira Blanco   DC/MEDQ  D:  01/03/2004  T:  01/03/2004  Job:  [id number]

## 2010-07-19 NOTE — H&P (Signed)
NAME:  Sandra Rivers, Sandra Rivers NO.:  [id number]   MEDICAL RECORD NO.:  [id number]          PATIENT TYPE:  AMB   LOCATION:  DAY                           FACILITY:  [facility]   PHYSICIAN:  Langley Gauss, MD     DATE OF BIRTH:  08/26/1970   DATE OF ADMISSION:  01/19/2004  DATE OF DISCHARGE:  LH                                HISTORY & PHYSICAL   A 40 year old, gravida 5 para 2, is about 12 weeks' gestation by unreliable  dates.  The patient is seen in the office today complaining of vaginal  bleeding with cramping and clots each of the previous two evenings with  continuing bleeding on date of presentation to the office, on January 19, 2004.  The patient had contacted me, late p.m. of January 18, 2004, stating  that she had passed two small clots and was having a moderate amount of  bright red vaginal bleeding with cramping.  Shortly after I talked with her  via telephone, on January 18, 2004, she states that she lay down and fell  asleep with no significant heavy bleeding or cramping the remainder of the  night.  The patient's pain, of course, has been complicated by definitive  findings and diagnoses ten days previously of a missed AB.  The patient was  noted to have undergone serial ultrasounds which had documented a  gestational sack, abnormal appearing fetal pole with at one point in time  fetal cardiac activity of 90 and on the following week no fetal cardiac  activity identified.  Had discussed this with the patient at that time and I  recommended dilatation and curettage, however, in the absence of any vaginal  bleeding or cramping the patient declined proceeding with D&C at that time  and rather preferred expectant management; however, now with the prior to  evenings of bleeding and cramping, the patient wishes to proceed with a  suction D&C.   PAST MEDICAL HISTORY:  1.  She states she is allergic to:      1.  DARVOCET.      2.  VICODIN.  2.  She states that she  takes Benadryl for itching and is able to tolerate      these; however, she has previously taken Tylenol #3 without any      problems.   CURRENT MEDICATIONS:  Include prenatal vitamins.   PAST MEDICAL HISTORY:  1.  Two prior vaginal deliveries at term.  2.  One very early first trimester spontaneous AB which did not require a      D&C.  3.  What she described as a 20 week loss which occurred when she was 28-      years-old.  However, the history the patient provides is more consistent      with that of a first trimester loss.   PHYSICAL EXAMINATION:  GENERAL:  She is in no acute distress but  appropriately distraught after evaluation in the office and recommendation  of proceeding with D&C.  VITAL SIGNS:  Temperature is 97.5, pulse 68, respiratory rate is 20, blood  pressure  is 120/71, height is 5 foot 8, weight is 188 pounds.  HEENT:  Negative.  No adenopathy.  NECK:  Supple.  Thyroid is not palpable.  LUNGS:  Clear.  CARDIOVASCULAR:  Regular rate and rhythm.  ABDOMEN:  Soft and nontender.  No surgical scars are identified.  No pelvic  masses.  EXTREMITIES:  Normal.  PELVIC:  Normal external genitalia.  Light amount of blood noted on the  thighs.  Sterile speculum examination reveals a moderate amount of vaginal  bleeding from a closed endocervix.   A transvaginal ultrasound performed in the office and interpreted by Dr.  Roylene Reason. Lisette Grinder, on January 19, 2004, reveals marked increased size in  the gestational sack noted to be round in configuration and obviously  contains products of conception as well as a blood clot within the uterine  cavity.  There is no free pelvic fluid.  The ovaries noted to be normal  bilaterally.   ASSESSMENT:  Spontaneous incomplete abortion.   PLAN:  Proceed with suction dilatation and curettage of uterine contents.  Risks and benefits of the procedure discussed with the patient and her  boyfriend, Reuel Boom, in the office.  Consents are signed.   The patient is  referred to [facility].  She is currently NPO.     Vira Blanco   DC/MEDQ  D:  01/19/2004  T:  01/19/2004  Job:  [id number]

## 2010-07-19 NOTE — Discharge Summary (Signed)
NAMEELIYANAH, Sandra Rivers NO.:  [id number]   MEDICAL RECORD NO.:  [id number]          PATIENT TYPE:  INP   LOCATION:  A427                          FACILITY:  [facility]   PHYSICIAN:  Langley Gauss, MD     DATE OF BIRTH:  1970-03-22   DATE OF ADMISSION:  08/19/2004  DATE OF DISCHARGE:  06/23/2006LH                                 DISCHARGE SUMMARY   DAY OF ADMISSION:  August 19, 2004   DATE OF DISCHARGE:  August 23, 2004   DATE OF OPERATIVE PROCEDURE:  August 22, 2004   See previous dictations. The patient was admitted on August 19, 2004  with  diagnosis of pyelonephritis. She received aggressive fluid hydration therapy  as well as antibiotic therapy. She did have a T-max of 103. The patient was  continued on IV antibiotics x72 hours. She was afebrile x48 hours at which  time on August 22, 2004 she was taken the operating room where the previously  planned [employer] cerclage was performed without complications. She is noted  to have a twin pregnancy. The patient was continued in hospitalization  overnight for continued monitoring. The patient had minimal complaints of  bleeding, no leakage of fluid, no vaginal bleeding. She was, thus,  discharged home on August 23, 2004 with plan for return visit subsequently on  August 26, 2004.   Discharge medications: The patient is given MetroGel for any abnormal  discharge that occurs, additionally Macrodantin 100 mg p.o. b.i.d. x7 days.   Pertinent laboratory studies: Gonorrhea and chlamydia cultures were noted to  be negative. Admission hemoglobin/hematocrit 10.6 and 29.7 with a white  count of 20.3; on hospitalization day #1, after initiation of antibiotic  therapy, hemoglobin 9.4 and hematocrit 26.1.  Pertinent laboratory studies:  O+ blood type. Urinalysis was pertinent for positive nitrites, small  leukocytes, many bacteria and only few epithelial cells. GC and chlamydia  cultures as stated previously were known to be negative. Subsequent  followup  hemoglobin 9.4, hematocrit 26.1 with a white count of 11.8.   Patient did well following placement of McDonald cerclage. She was fully  ambulatory, had no complaints of vaginal bleeding, was given the MetroGel to  go home with, she will continue with modified bed rest, and she is reminded  that she is to be considered a high risk pregnancy with the twin gestation  as well as placement of cerclage due to very rapid pregnancy after cold-  knife conization performed in [place].      DC/MEDQ  D:  09/17/2004  T:  09/17/2004  Job:  [id number]

## 2010-07-19 NOTE — Discharge Summary (Signed)
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  Please see the dictated history and physical as per Dr. Katheren Shams-  Sharol Given for further details.   HOSPITAL COURSE:  The patient was admitted and underwent a total abdominal  hysterectomy.  Please see the dictated operative summary for further  details.  Her postoperative course was uneventful.  She was discharged to  home on postoperative day #2 tolerating a regular diet.   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   PROCEDURE:  Total abdominal hysterectomy.   CONDITION:  Good.   DIET:  Regular.   ACTIVITY:  Progressive.   MEDICATIONS:  Percocet 1-2 tabs every 6 hours as needed.   DISPOSITION:  The patient was to follow up in the GYN Oncology office on  March 24, 2005.      Roseanna Rainbow, M.D.  Electronically Signed     LAJ/MEDQ  D:  04/08/2005  T:  04/08/2005  Job:  [id number]   cc:   Telford Nab, R.N.  [address]  [place], [place] [address]   [facility]   Langley Gauss, MD  Fax: [phone]

## 2010-07-19 NOTE — H&P (Signed)
NAME:  Sandra, Rivers NO.:  [id number]   MEDICAL RECORD NO.:  [id number]          PATIENT TYPE:  INP   LOCATION:  A427                          FACILITY:  [facility]   PHYSICIAN:  Langley Gauss, MD     DATE OF BIRTH:  1970-10-02   DATE OF ADMISSION:  08/19/2004  DATE OF DISCHARGE:  LH                                HISTORY & PHYSICAL   HISTORY OF PRESENT ILLNESS:  The patient is currently an inpatient being  treated for pyelonephritis.  She has been afebrile for greater than 72 hours  with the last temperature maximum 103.5 taken at home on August 18, 2004.  She  is on Rocephin 1 g IV q.12h.  The patient's prenatal records are included  and indicate that she became pregnant immediately after a cold knife  conization was performed in [place].  The patient was advised to wait  until at least three months prior to conceiving but she promptly was feeling  that she wishes to have a baby this year, that she conceived immediately  after only a very brief recovery period.  Pertinent in her history is that  she did have an ultrasound performed in the department of radiology, which  is stated August 21, 2004, revealing a cervical length of 2.2 cm, which is  shortened.  Twin gestation with a probable thin separating membrane.  Each  of the twin pregnancies consistent with about [redacted] weeks gestation.   We plan on placing a [employer] cerclage on August 22, 2004.  The patient is  aware that this is not a treatment for preterm labor but rather  precautionary due to her risk of incompetent cervix due to the very recent  LEEP and cold knife conization with some possible loss of cervical length  and structural integrity of the cervix.  The patient is likewise made aware  that she is to be considered a high risk pregnancy due to the pregnancy of a  twin gestation.   PAST MEDICAL HISTORY:  She has two prior vaginal deliveries, an 40 year old  and a 40 year old.   ALLERGIES:  1.   VICODIN.  2.  DARVOCET.  3.  During this hospitalization when she was treated with IV Buprenex, she      took p.o. Benadryl for itching with minimal relief.   MEDICATIONS:  1.  Typically the patient takes only Tylenol for pain purposes.  2.  Prenatal vitamins.  3.  Rocephin 1 g IV q.12h.   REVIEW OF SYSTEMS:  The patient recently did have invasive adenocarcinoma  identified on the LEEP specimen done on March 05, 2004.  Subsequently she  was seen and referred to the oncologist, who did perform a large knife  conization of the cervix, which did not reveal any invasive cervical cancer.  The patient recently did have a first trimester incomplete spontaneous AB.  She did have suction D&C performed January 19, 2004 with products of  conception obtained.  In 1996 a vaginal delivery of a 9 pounds 7 ounce  infant.  In June 2001 a vaginal delivery of a 9 pound 11  ounce infant.  She  had an epidural with both of these.  Normal glucose tolerance test with each  of these pregnancies.  No know medical or surgical history.   SOCIAL HISTORY:  She smokes 1/2 pack/day.  She was previously employed at  [employer].   PHYSICAL EXAMINATION:  VITAL SIGNS:  Height is 5 feet 8 inches, weight 212  pounds.  Blood pressure 141/79.  HEENT:  Negative.  No adenopathy.  NECK:  Supple.  Thyroid is not palpable.  LUNGS:  Clear.  CARDIAC:  Regular rate and rhythm.  ABDOMEN:  Soft and non-tender.  No surgical scars are identified.  18 weeks  size uterus.  PELVIC:  Normal external genitalia.  No lesions or ulcerations identified.  No vaginal bleeding.  No leakage of fluid.   LABORATORY STUDIES:  Pertinent for initial white count on presentation of  20,000.  This has fallen to 11.1 date August 21, 2004.   ASSESSMENT/PLAN:  The patient was originally scheduled for placement of the  cerclage on August 20, 2004 but presented and was admitted on August 19, 2004  with findings of pyelonephritis.  The  patient is now afebrile greater than  72 hours.  White count has normalized.  She is also noted now to have a new  finding now of the twin gestation.  The cervix is noted to be shortened on  examination and by the transvaginal ultrasound, but no funneling of the  lower uterine segment.  On August 26, 2004 the patient will be taken to the  operating room where a [employer] cerclage will be performed.  The patient  will restrict activities to modified bedrest during the remainder of the  pregnancy.       DC/MEDQ  D:  08/21/2004  T:  08/21/2004  Job:  [id number]

## 2010-07-19 NOTE — Discharge Summary (Signed)
NAME:  Sandra Rivers, CIMINI NO.:  [id number]   MEDICAL RECORD NO.:  [id number]          PATIENT TYPE:  AMB   LOCATION:  DAY                           FACILITY:  [facility]   PHYSICIAN:  Langley Gauss, MD     DATE OF BIRTH:  June 10, 1970   DATE OF ADMISSION:  01/19/2004  DATE OF DISCHARGE:  LH                                 DISCHARGE SUMMARY   The patient had procedure performed on an outpatient basis January 19, 2004.  Plan on discharging the patient home this p.m. when she satisfies  criteria.   PERTINENT LABORATORY STUDIES:  O positive blood type.  Hemoglobin 13.5,  hematocrit 38.5, with a white count of 11.2.  Quantitative beta HCG was  performed in day surgery, revealing a number of 14,597.  This was done  January 09, 2004.  This was not repeated on today's date, but she was noted  to have a positive urine pregnancy test in the office.   DISCHARGE MEDICATIONS:  The patient received 1 g of IV Ancef immediately  preoperatively.  At discharge she is given doxycycline 100 mg p.o. b.i.d. x7  days; Tylenol No. 3, #30 with no refill, for postoperative discomfort.   DISPOSITION:  She is given instructions and will be seen in the office in  one week's time for review of pathology results.   HOSPITAL COURSE:  The patient was seen in the office on January 19, 2004.  Transvaginal ultrasound performed there by Dr. Roylene Reason. Lisette Grinder.  The  patient was then scheduled.  Suction and curettage of the uterine contents  performed without complications.  Postoperatively the patient did well with  plans for her to be discharged home today on the same date of service.     Vira Blanco   DC/MEDQ  D:  01/19/2004  T:  01/20/2004  Job:  [id number]

## 2010-07-19 NOTE — Discharge Summary (Signed)
[facility]  Patient:    Sandra Rivers, Sandra Rivers                       MRN: [id number] Adm. Date:  [id number] Disc. Date: [id number] Attending:  Jasmine Pang CC:         Charlies Silvers, M.D.   Discharge Summary  REASON FOR ADMISSION:  Patient was a 40 year old Caucasian female from [facility] referred on [facility] papers.  She had been depressed due to a conflict with her husband.  She had multiple neurovegetative symptoms.  She made suicidal threats including stating she would overdose or shoot herself.  Her husband committed her because he does not think she can be stabilized on an outpatient basis.  Patient had just been at [facility] two weeks ago, February 25 to April 29, 2000.  She had been placed on Celexa by Dr. Claudette Head.  For further psychiatric admission information, see dictated psychiatric admission assessment.  PHYSICAL EXAMINATION:  There were no acute medical problems.  ADMISSION LABORATORIES:  A urine drug screen was negative.  Urine pregnancy test was negative.  CBC was grossly within normal limits except for an elevated WBC count of 14.8 when she was seen in the emergency room (4-10.5). The chemistry profile was grossly within normal limits except for a decreased potassium of 3.3 (3.5-5.0).  A repeat level was done on April 27, 2000 and potassium was within normal limits at 3.9.  Hepatic panel was within normal limits.  TSH was within normal limits.  Urinalysis was grossly within normal limits.  HOSPITAL COURSE:  Patient, upon admission, was continued on Celexa.  This was increased from 10 mg q.h.s. to 20 mg q.h.s.  On the following day, this was changed to morning time due to some activation she was experiencing at night from the Celexa.  She tolerated the medication well with no significant side effects.  She was able to participate appropriately in unit therapeutic groups and activities.  She had family  therapy with her husband, who stated he and the baby planned to live with his mother for a period of time and decide whether they would reconcile.  Patient was at first very tearful and sad but became happier and more optimistic about the future.  She discussed a number of support systems she had at home and the enjoyment she had at times that she did have her baby with her.  She had her home to go to since her husband was moving out.  She stated she wanted to consider going to nursing school in the future and felt that, when her mood improved, she had the possibility of a happy life.  DISCHARGE MENTAL STATUS EXAMINATION:  Eye contact improved.  Less psychomotor retardation.  Mood and affect less depressed and anxious.  No suicidal or homicidal ideation.  No self-injurious behavior or aggression.  No psychosis or perceptual disturbance.  Thought processes logical and goal directed. Cognitive exam was within normal limits, back to baseline.  Her judgment had improved and insight was still fair.  DISCHARGE DIAGNOSES: Axis I:    1. Major depression, recurrent, severe without psychosis.            2. Post-traumatic stress disorder, chronic-type.            3. Polysubstance dependence. 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 Axis III:  Healthy except for tendon problems in left ankle. Axis IV:  Severe. Axis V:    Global Assessment of Functioning:  Current 50; admission 30;            highest past year 65.  DISCHARGE MEDICATIONS:  Celexa 20 mg q.a.m.  ACTIVITY LEVEL:  No restrictions.  DIET:  No restrictions.  POST-HOSPITAL CARE PLANS:  Patient will return to her home.  She states her husband and some friends are very supportive of her.  Follow-up and medication management will be with Charlies Silvers, M.D.  She is also scheduled to see a nurse therapist for ongoing therapy. DD:  05/06/00 TD:  05/06/00 Job: [id number]

## 2010-07-19 NOTE — Consult Note (Signed)
NAME:  Sandra Rivers, Sandra Rivers NO.:  [id number]   MEDICAL RECORD NO.:  [id number]          PATIENT TYPE:  OUT   LOCATION:  GYN                            FACILITY:   PHYSICIAN:  De Blanch, M.D.DATE OF BIRTH:  May 31, 1970   DATE OF CONSULTATION:  04/02/2004  DATE OF DISCHARGE:                                   CONSULTATION   A 40 year old white female seen in consultation at the request of Langley Gauss, MD of [place] regarding the management of an early invasive  adenocarcinoma of the cervix.  The patient claims that she has had normal  Pap smears in the past but recently had a Pap smear which showed atypical  squamous and atypical glandular cells.  She underwent colposcopy and  directed biopsy which revealed adenocarcinoma in situ.  Subsequently, she  underwent a LEEP procedure which revealed an invasive endocervical  adenocarcinoma associated with adenocarcinoma in situ.  A review of this  specimen shows that the carcinoma measures at least 3 mm in depth and 4 mm  in lateral extent.  There is invasive carcinoma present at the inked deep  surgical and lateral surgical margins.  There is no evidence of lymph,  vascular invasion.  The patient expresses a very strong desire to avoid  hysterectomy as she wishes to have more children.   PAST MEDICAL HISTORY:  Medical illnesses:  None.   PAST SURGICAL HISTORY:  D&C (incomplete abortion) and LEEP procedure.   CURRENT MEDICATIONS:  None.   DRUG ALLERGIES:  Vicodin and Darvocet cause itching.   OBJECTIVE:  Gravida 5, para 2.  One of her children has been adopted.  The  other is a 22-year-old who lives with the patient.  The patient lives with  her fiancee who has three of his own children.   FAMILY HISTORY:  The patient has a grandmother with colon cancer and cervix  cancer.   SOCIAL HISTORY:  The patient is a housekeeper and smokes one pack-per-day.   REVIEW OF SYSTEMS:  Otherwise negative.   PHYSICAL  EXAMINATION:  Vital signs:  Height 5 feet 8 inches, weight 193  pounds, blood pressure 120/72, pulse 80, respiratory rate 18.  General:  The patient is a healthy, pleasant, white female in no acute  distress.  HEENT:  Negative.  Neck:  Supple, without thyromegaly.  There is no supraclavicular or inguinal  adenopathy.  Abdomen:  Soft, non-tender.  No masses, organomegaly.  The site of a hernia  is noted.  She has a navel piercing.  Pelvic:  EG, BUS, vagina, bladder, and urethra are normal.  The cervix is  inspected and appears to be healing nicely.  Bimanual rectovaginal exam  reveals small uterus.  No adnexal masses are noted.   IMPRESSION:  Adenocarcinoma with 3 millimeters of invasion.   I had a lengthy discussion with the patient and she is exceedingly adamant  that she does not wish to have a hysterectomy if at all possible.  Given the  fact that we have only been able to detect 3 mm of invasion but there are  positive margins, I  would recommend the patient undergo a repeat cold knife  conization to asses for anymore advanced disease.  She understands that the  usual recommendation would be to perform a radical hysterectomy with pelvic  lymphadenectomy although ovaries could be conserved.  She does not wish to  have a hysterectomy and, therefore, we will base further recommendations on  the results of the cold knife conization which we will schedule for the next  week or so.   In the interim, the patient notes that her last menstrual period was a month  ago and, therefore, a hCG is repeated and is negative.      DC/MEDQ  D:  04/02/2004  T:  04/02/2004  Job:  [id number]   cc:   Langley Gauss, MD  [address]  [place]  [place] [address]  Fax: [phone]   Telford Nab, R.N.  [address]  [place], [place] [address]

## 2010-08-19 ENCOUNTER — Emergency Department (HOSPITAL_COMMUNITY)
Admission: EM | Admit: 2010-08-19 | Discharge: 2010-08-19 | Discharge status: Against Medical Advice | Payer: 59 | Attending: Emergency Medicine | Admitting: Emergency Medicine

## 2011-07-01 ENCOUNTER — Encounter (HOSPITAL_COMMUNITY): Payer: Self-pay

## 2011-07-01 ENCOUNTER — Emergency Department (HOSPITAL_COMMUNITY)
Admission: EM | Admit: 2011-07-01 | Discharge: 2011-07-01 | Disposition: A | Discharge status: Home / Self Care | Payer: 59 | Attending: Emergency Medicine | Admitting: Emergency Medicine

## 2011-07-01 DIAGNOSIS — M545 Low back pain, unspecified: Secondary | ICD-10-CM | POA: Insufficient documentation

## 2011-07-01 DIAGNOSIS — J020 Streptococcal pharyngitis: Secondary | ICD-10-CM | POA: Insufficient documentation

## 2011-07-01 DIAGNOSIS — F17200 Nicotine dependence, unspecified, uncomplicated: Secondary | ICD-10-CM | POA: Insufficient documentation

## 2011-07-01 HISTORY — DX: Malignant neoplasm of cervix uteri, unspecified: C53.9

## 2011-07-01 HISTORY — DX: Malignant (primary) neoplasm, unspecified: C80.1

## 2011-07-01 LAB — URINALYSIS, ROUTINE W REFLEX MICROSCOPIC
Ketones, ur: NEGATIVE mg/dL
Leukocytes, UA: NEGATIVE
Nitrite: NEGATIVE
Urobilinogen, UA: 0.2 mg/dL (ref 0.0–1.0)

## 2011-07-01 LAB — URINE MICROSCOPIC-ADD ON

## 2011-07-01 MED ORDER — CEFTRIAXONE SODIUM 1 G IJ SOLR
1.0000 g | Freq: Once | INTRAMUSCULAR | Status: AC
  Administered 2011-07-01: 1 g via INTRAMUSCULAR
  Filled 2011-07-01: qty 10

## 2011-07-01 MED ORDER — CEPHALEXIN 500 MG PO CAPS: 500.0000 mg | ORAL_CAPSULE | Freq: Four times a day (QID) | ORAL | Status: AC

## 2011-07-01 MED ORDER — SODIUM CHLORIDE 0.9 % IV BOLUS (SEPSIS)
1000.0000 mL | Freq: Once | INTRAVENOUS | Status: AC
  Administered 2011-07-01: 1000 mL via INTRAVENOUS

## 2011-07-01 MED ORDER — IBUPROFEN 400 MG PO TABS
400.0000 mg | ORAL_TABLET | Freq: Once | ORAL | Status: AC
  Administered 2011-07-01: 400 mg via ORAL
  Filled 2011-07-01: qty 1

## 2011-07-01 NOTE — ED Provider Notes (Signed)
History       CSN: [id number]  Arrival date & time 07/01/11  1430   First MD Initiated Contact with Patient 07/01/11 1442      Chief Complaint  Patient presents with  . Urinary Tract Infection  . Sore Throat    swollen lymph node     (Consider location/radiation/quality/duration/timing/severity/associated sxs/prior treatment) HPI  Sandra Rivers is a 41 y.o. female who presents to the Emergency Department complaining of  Lower back pain and sore throat. Back pain has been going on for about a week and sore throat for past day. Subjective fever. No change in voice. No drooling. No difficulty with breathing. Dysuria. No hematuria. No change in urine output. No n/v/d. No abdominal pain. No unusual vag bleeding or discharge.   Past Medical History  Diagnosis Date  . Cancer   . Cervical cancer     Past Surgical History  Procedure Date  . Abdominal hysterectomy     No family history on file.  History  Substance Use Topics  . Smoking status: Current Everyday Smoker    Types: Cigarettes  . Smokeless tobacco: Not on file  . Alcohol Use: Yes     occ    OB History    Grav Para Term Preterm Abortions TAB SAB Ect Mult Living                  Review of Systems  All other systems reviewed and are negative.    10 Systems reviewed and all are negative for acute change except as noted in the HPI.    Allergies  Review of patient's allergies indicates not on file.  Home Medications  No current outpatient prescriptions on file.  BP 184/98  Pulse 103  Temp(Src) 100.9 F (38.3 C) (Oral)  Resp 20  Ht 5\' 8"  (1.727 m)  Wt 224 lb (101.606 kg)  BMI 34.06 kg/m2  SpO2 100%  Physical Exam  Nursing note and vitals reviewed. Constitutional: She appears well-developed and well-nourished. No distress.  HENT:  Head: Normocephalic and atraumatic.  Mouth/Throat: Oropharyngeal exudate present.       Erythema posterior pharynx with tonisillar exudate. uvula midline. No  drooling. No other oral lesions noted. No tongue elevation. Submental tissues soft. L anterior cervical adenopathy. Neck supple. No stridor.  Eyes: Conjunctivae are normal. Pupils are equal, round, and reactive to light. Right eye exhibits no discharge. Left eye exhibits no discharge.  Neck: Normal range of motion. Neck supple. No tracheal deviation present.  Cardiovascular: Normal rate, regular rhythm and normal heart sounds.  Exam reveals no gallop and no friction rub.   No murmur heard. Pulmonary/Chest: Effort normal and breath sounds normal. No stridor. No respiratory distress.  Abdominal: Soft. She exhibits no distension. There is no tenderness.  Genitourinary:       No cva tenderness  Musculoskeletal: She exhibits no edema and no tenderness.  Lymphadenopathy:    She has cervical adenopathy.  Neurological: She is alert.  Skin: Skin is warm and dry.  Psychiatric: She has a normal mood and affect. Her behavior is normal. Thought content normal.    ED Course  Procedures (including critical care time)  Labs Reviewed - No data to display No results found.   1. Strep pharyngitis       MDM  40yF with sore throat and back pain. 4/4 centor criteria. Will tx for strep. No evidence of deep space infection. Possible uti. Keflex should cover for both. Return precautions dicsussed. outpt  fu otherwise.        Raeford Razor, MD 07/04/11 [id number]

## 2011-07-01 NOTE — ED Notes (Signed)
1. uti s/s, bil flank pain, had urinary incont. Today while at work, fever and chills. Had antibiotics following dental procedure and felt better but symptoms have now returned. 2. Sore throat that started last night and has swollen lymph node to left side of neck.

## 2011-07-01 NOTE — Discharge Instructions (Signed)
Strep Throat Strep throat is an infection of the throat caused by a bacteria named Streptococcus pyogenes. Your caregiver may call the infection streptococcal "tonsillitis" or "pharyngitis" depending on whether there are signs of inflammation in the tonsils or back of the throat. Strep throat is most common in children from 77 to 41 years old during the cold months of the year, but it can occur in people of any age during any season. This infection is spread from person to person (contagious) through coughing, sneezing, or other close contact. SYMPTOMS   Fever or chills.   Painful, swollen, red tonsils or throat.   Pain or difficulty when swallowing.   White or yellow spots on the tonsils or throat.   Swollen, tender lymph nodes or "glands" of the neck or under the jaw.   Red rash all over the body (rare).  DIAGNOSIS  Many different infections can cause the same symptoms. A test must be done to confirm the diagnosis so the right treatment can be given. A "rapid strep test" can help your caregiver make the diagnosis in a few minutes. If this test is not available, a light swab of the infected area can be used for a throat culture test. If a throat culture test is done, results are usually available in a day or two. TREATMENT  Strep throat is treated with antibiotic medicine. HOME CARE INSTRUCTIONS   Gargle with 1 tsp of salt in 1 cup of warm water, 3 to 4 times per day or as needed for comfort.   Family members who also have a sore throat or fever should be tested for strep throat and treated with antibiotics if they have the strep infection.   Make sure everyone in your household washes their hands well.   Do not share food, drinking cups, or personal items that could cause the infection to spread to others.   You may need to eat a soft food diet until your sore throat gets better.   Drink enough water and fluids to keep your urine clear or pale yellow. This will help prevent  dehydration.   Get plenty of rest.   Stay home from school, daycare, or work until you have been on antibiotics for 24 hours.   Only take over-the-counter or prescription medicines for pain, discomfort, or fever as directed by your caregiver.   If antibiotics are prescribed, take them as directed. Finish them even if you start to feel better.  SEEK MEDICAL CARE IF:   The glands in your neck continue to enlarge.   You develop a rash, cough, or earache.   You cough up green, yellow-brown, or bloody sputum.   You have pain or discomfort not controlled by medicines.   Your problems seem to be getting worse rather than better.  SEEK IMMEDIATE MEDICAL CARE IF:   You develop any new symptoms such as vomiting, severe headache, stiff or painful neck, chest pain, shortness of breath, or trouble swallowing.   You develop severe throat pain, drooling, or changes in your voice.   You develop swelling of the neck, or the skin on the neck becomes red and tender.   You have a fever.   You develop signs of dehydration, such as fatigue, dry mouth, and decreased urination.   You become increasingly sleepy, or you cannot wake up completely.  Document Released: 02/15/2000 Document Revised: 02/06/2011 Document Reviewed: 04/18/2010 [facility] Patient Information 2012 [place], [place].Strep Infections Streptococcal (strep) infections are caused by streptococcal germs (bacteria). Strep  infections are very contagious. Strep infections can occur in:  Ears.   The nose.   The throat.   Sinuses.   Skin.   Blood.   Lungs.   Spinal fluid.   Urine.  Strep throat is the most common bacterial infection in children. The symptoms of a Strep infection usually get better in 2 to 3 days after starting medicine that kills germs (antibiotics). Strep is usually not contagious after 36 to 48 hours of antibiotic treatment. Strep infections that are not treated can cause serious complications. These include  gland infections, throat abscess, rheumatic fever and kidney disease. DIAGNOSIS  The diagnosis of strep is made by:  A culture for the strep germ.  TREATMENT  These infections require oral antibiotics for a full 10 days, an antibiotic shot or antibiotics given into the vein (intravenous, IV). HOME CARE INSTRUCTIONS   Be sure to finish all antibiotics even if feeling better.   Only take over-the-counter medicines for pain, discomfort and or fever, as directed by your caregiver.   Close contacts that have a fever, sore throat or illness symptoms should see their caregiver right away.   You or your child may return to work, school or daycare if the fever and pain are better in 2 to 3 days after starting antibiotics.  SEEK MEDICAL CARE IF:   You or your child has an oral temperature above 102 F (38.9 C).   Your baby is older than 3 months with a rectal temperature of 100.5 F (38.1 C) or higher for more than 1 day.   You or your child is not better in 3 days.  SEEK IMMEDIATE MEDICAL CARE IF:   You or your child has an oral temperature above 102 F (38.9 C), not controlled by medicine.   Your baby is older than 3 months with a rectal temperature of 102 F (38.9 C) or higher.   Your baby is 27 months old or younger with a rectal temperature of 100.4 F (38 C) or higher.   There is a spreading rash.   There is difficulty swallowing or breathing.   There is increased pain or swelling.  Document Released: 03/27/2004 Document Revised: 02/06/2011 Document Reviewed: 01/03/2009 [facility] Patient Information 2012 [place], [place].  RESOURCE GUIDE  Dental Problems  Patients with Medicaid: [facility] [id number]                                           [phone] Phone:  [phone]                                                  Phone:  [phone]  If unable to pay or uninsured, contact:  [facility] or [facility]. to become qualified for the adult dental clinic.  Chronic Pain Problems Contact [facility]  [phone] Patients need to be referred by their primary care doctor.  Insufficient Money for Medicine Contact [employer]:  call "211" or [facility] [phone].  No Primary Care Doctor Call [facility]  [phone] Other  agencies that provide inexpensive medical care    [facility]  [phone]    [facility]  [phone]    [facility]  [phone]    [facility]  [phone]    [facility]  [phone]    [facility]  [phone]  Psychological Services [facility] Behavioral Health  [phone] [facility]  4843643044 [facility] Mental Health   [phone] (emergency services [phone])  Substance Abuse Resources [facility]  [phone] [facility] 2088370349 [facility] [phone] Floydene Flock [phone] Residential & Outpatient Substance Abuse Program  442-440-2032  Abuse/Neglect [facility] Child Abuse Hotline 781-488-7576 [facility] Child Abuse Hotline 915-877-4810 (After Hours)  Emergency Shelter [facility] ([phone]  Maternity Homes [facility] at [facility] [place] [facility] [place] 404-530-3722 [facility] [phone]  MRSA Hotline #:   9024792866    [facility] Resources  [facility] [place]     [employer]                          [facility] Dept. [address] [place]                       [address]      [address]  Blondell Reveal Phone:  [phone]                                   Phone:  [phone]                 Phone:  [phone]  [facility] Phone:  [phone]  [facility] Child Abuse Hotline [phone] (After  Hours)

## 2011-07-03 LAB — URINE CULTURE
Colony Count: NO GROWTH
Culture  Setup Time: 201305010147
Culture: NO GROWTH
Special Requests: NORMAL

## 2011-09-06 ENCOUNTER — Emergency Department (HOSPITAL_COMMUNITY)
Admission: EM | Admit: 2011-09-06 | Discharge: 2011-09-06 | Disposition: A | Discharge status: Home / Self Care | Payer: 59 | Attending: Emergency Medicine | Admitting: Emergency Medicine

## 2011-09-06 ENCOUNTER — Encounter (HOSPITAL_COMMUNITY): Payer: Self-pay

## 2011-09-06 DIAGNOSIS — L03019 Cellulitis of unspecified finger: Secondary | ICD-10-CM | POA: Insufficient documentation

## 2011-09-06 DIAGNOSIS — Z8541 Personal history of malignant neoplasm of cervix uteri: Secondary | ICD-10-CM | POA: Insufficient documentation

## 2011-09-06 DIAGNOSIS — F17200 Nicotine dependence, unspecified, uncomplicated: Secondary | ICD-10-CM | POA: Insufficient documentation

## 2011-09-06 DIAGNOSIS — IMO0002 Reserved for concepts with insufficient information to code with codable children: Secondary | ICD-10-CM

## 2011-09-06 MED ORDER — LIDOCAINE-EPINEPHRINE (PF) 1 %-1:200000 IJ SOLN
INTRAMUSCULAR | Status: AC
  Filled 2011-09-06: qty 10

## 2011-09-06 MED ORDER — AMOXICILLIN-POT CLAVULANATE 500-125 MG PO TABS: 1.0000 | ORAL_TABLET | Freq: Three times a day (TID) | ORAL | Status: AC

## 2011-09-06 MED ORDER — IBUPROFEN 800 MG PO TABS
ORAL_TABLET | ORAL | Status: AC
  Filled 2011-09-06: qty 1

## 2011-09-06 MED ORDER — IBUPROFEN 800 MG PO TABS
800.0000 mg | ORAL_TABLET | Freq: Once | ORAL | Status: AC
  Administered 2011-09-06: 800 mg via ORAL

## 2011-09-06 MED ORDER — LIDOCAINE HCL (PF) 1 % IJ SOLN
INTRAMUSCULAR | Status: AC
  Administered 2011-09-06: 04:00:00
  Filled 2011-09-06: qty 5

## 2011-09-06 MED ORDER — AMOXICILLIN-POT CLAVULANATE 500-125 MG PO TABS
1.0000 | ORAL_TABLET | Freq: Once | ORAL | Status: AC
  Administered 2011-09-06: 500 mg via ORAL
  Filled 2011-09-06: qty 1

## 2011-09-06 NOTE — ED Notes (Signed)
Swelling, pain and redness to cuticle and tip of right pinky finger x 2-3 days

## 2011-09-06 NOTE — ED Notes (Signed)
Room set up for finger to be drained by MD

## 2011-09-06 NOTE — ED Notes (Signed)
Pt instructed to finish all antibiotics; Pt also instructed to follow up with PCP for B/P check Instructed pt that B/P was elevated. Pt states that she will

## 2011-09-06 NOTE — ED Notes (Signed)
Right pinky finger swollen with redness noted from under cultical to tip of finger; area warm to touch; pt states that she is having trouble bending the finger due to pain in the finger joints; Rates pain at a 6; Nail bed cracked and brittle appearing

## 2011-09-06 NOTE — ED Notes (Signed)
Pt stable at discharge; Pt instructed to watch for increasing infection and instructed to follow up with PCP Verbalized understand

## 2011-09-06 NOTE — ED Notes (Signed)
MD at bedside.MD opened up wound to drain. No pus noted. Finger still red and swollen; Pt tolerated well

## 2011-09-06 NOTE — ED Provider Notes (Signed)
History     CSN: [id number]  Arrival date & time 09/06/11  0126   First MD Initiated Contact with Patient 09/06/11 0157      Chief Complaint  Patient presents with  . Finger Injury    (Consider location/radiation/quality/duration/timing/severity/associated sxs/prior treatment) HPI Sandra Rivers is a 41 y.o. female who presents to the Emergency Department complaining of 5th finger pain on the right due to swelling and redness. Patient bites her nails and she works as a Therapist, nutritional. Noticed swelling and soreness two days ago which became worse today. Has taken ibuprofen with no relief. Attempted to put a needle in the cuticle area with no return. Denies fever, chills.  PCP Dr. Sherwood Gambler   Past Medical History  Diagnosis Date  . Cancer   . Cervical cancer     Past Surgical History  Procedure Date  . Abdominal hysterectomy     No family history on file.  History  Substance Use Topics  . Smoking status: Current Everyday Smoker    Types: Cigarettes  . Smokeless tobacco: Not on file  . Alcohol Use: Yes     occ    OB History    Grav Para Term Preterm Abortions TAB SAB Ect Mult Living                  Review of Systems  Constitutional: Negative for fever.       10 Systems reviewed and are negative for acute change except as noted in the HPI.  HENT: Negative for congestion.   Eyes: Negative for discharge and redness.  Respiratory: Negative for cough and shortness of breath.   Cardiovascular: Negative for chest pain.  Gastrointestinal: Negative for vomiting and abdominal pain.  Musculoskeletal: Negative for back pain.  Skin: Negative for rash.       Swelling and redness around the right 5th finger nail.  Neurological: Negative for syncope, numbness and headaches.  Psychiatric/Behavioral:       No behavior change.    Allergies  Morphine and related; Bee venom; and Vicodin  Home Medications   Current Outpatient Rx  Name Route Sig Dispense Refill  . CALCIUM  CARBONATE ANTACID 500 MG PO CHEW Oral Chew 1 tablet by mouth every evening. As needed    . IBUPROFEN 200 MG PO TABS Oral Take 800 mg by mouth once as needed. For pain      BP 168/104  Pulse 88  Temp 97.6 F (36.4 C) (Oral)  Resp 20  Ht 5\' 8"  (1.727 m)  Wt 214 lb (97.07 kg)  BMI 32.54 kg/m2  SpO2 99%  Physical Exam  Nursing note and vitals reviewed. Constitutional:       Awake, alert, nontoxic appearance.  HENT:  Head: Atraumatic.  Eyes: Right eye exhibits no discharge. Left eye exhibits no discharge.  Neck: Neck supple.  Cardiovascular: Normal heart sounds.   Pulmonary/Chest: Effort normal and breath sounds normal. She exhibits no tenderness.  Abdominal: Soft. There is no tenderness. There is no rebound.  Musculoskeletal: She exhibits no tenderness.       Baseline ROM, no obvious new focal weakness.  Neurological:       Mental status and motor strength appears baseline for patient and situation.  Skin: No rash noted.       Swelling and erythema surrounding the nail bed on right 5th finger. C/w perionychia and early felon.  Psychiatric: She has a normal mood and affect.    ED Course  Drain paronychia Date/Time:  09/06/2011 3:34 AM Performed by: Annamarie Dawley. Authorized by: Annamarie Dawley Consent: Verbal consent obtained. Risks and benefits: risks, benefits and alternatives were discussed Consent given by: patient Patient understanding: patient states understanding of the procedure being performed Patient identity confirmed: verbally with patient and arm band Time out: Immediately prior to procedure a "time out" was called to verify the correct patient, procedure, equipment, support staff and site/side marked as required. Preparation: Patient was prepped and draped in the usual sterile fashion. Local anesthesia used: yes Anesthesia: digital block Local anesthetic: lidocaine 1% without epinephrine Anesthetic total: 2 ml Patient sedated: no Patient tolerance: Patient  tolerated the procedure well with no immediate complications. Comments: Using 11 blade, incision made behind nail bed with drainage of moderate amount of blood under pressure. No purulent material.    20 minutes   MDM  Patient presents with paronychia and early felon of right 5th finger. Digital block with drainage performed. Initiated antibiotic therapy. Antiinflammatory given. Pt stable in ED with no significant deterioration in condition.The patient appears reasonably screened and/or stabilized for discharge and I doubt any other medical condition or other [facility] requiring further screening, evaluation, or treatment in the ED at this time prior to discharge.  MDM Reviewed: nursing note and vitals           Nicoletta Dress. Colon Branch, MD 09/06/11 [phone]

## 2012-05-05 ENCOUNTER — Other Ambulatory Visit: Payer: Self-pay | Admitting: Obstetrics & Gynecology

## 2012-05-05 DIAGNOSIS — Z139 Encounter for screening, unspecified: Secondary | ICD-10-CM

## 2012-05-13 ENCOUNTER — Ambulatory Visit (HOSPITAL_COMMUNITY)
Admission: RE | Admit: 2012-05-13 | Discharge: 2012-05-13 | Disposition: A | Discharge status: Home / Self Care | Payer: 59 | Source: Ambulatory Visit | Attending: Obstetrics & Gynecology | Admitting: Obstetrics & Gynecology

## 2012-05-13 DIAGNOSIS — Z139 Encounter for screening, unspecified: Secondary | ICD-10-CM

## 2012-05-13 DIAGNOSIS — Z1231 Encounter for screening mammogram for malignant neoplasm of breast: Secondary | ICD-10-CM | POA: Insufficient documentation

## 2014-06-22 ENCOUNTER — Other Ambulatory Visit (HOSPITAL_COMMUNITY)
Admission: RE | Admit: 2014-06-22 | Discharge: 2014-06-22 | Disposition: A | Discharge status: Home / Self Care | Payer: BLUE CROSS/BLUE SHIELD | Source: Ambulatory Visit | Attending: Obstetrics and Gynecology | Admitting: Obstetrics and Gynecology

## 2014-06-22 ENCOUNTER — Encounter: Payer: Self-pay | Admitting: Obstetrics and Gynecology

## 2014-06-22 ENCOUNTER — Ambulatory Visit (INDEPENDENT_AMBULATORY_CARE_PROVIDER_SITE_OTHER): Payer: BLUE CROSS/BLUE SHIELD | Admitting: Obstetrics and Gynecology

## 2014-06-22 VITALS — BP 106/80 | Ht 68.0 in | Wt 227.0 lb

## 2014-06-22 DIAGNOSIS — Z01419 Encounter for gynecological examination (general) (routine) without abnormal findings: Secondary | ICD-10-CM | POA: Diagnosis not present

## 2014-06-22 DIAGNOSIS — Z01411 Encounter for gynecological examination (general) (routine) with abnormal findings: Secondary | ICD-10-CM | POA: Insufficient documentation

## 2014-06-22 DIAGNOSIS — Z1151 Encounter for screening for human papillomavirus (HPV): Secondary | ICD-10-CM | POA: Insufficient documentation

## 2014-06-22 DIAGNOSIS — Z0000 Encounter for general adult medical examination without abnormal findings: Secondary | ICD-10-CM

## 2014-06-22 NOTE — Progress Notes (Signed)
Patient ID: Sandra Rivers, female   DOB: [birth year], 44 y.o.   MRN: [id number] Pt here today for annual exam. Pt states that she has no energy at all. Pt states that she has an abscess on the left side of vagina at the crease of her leg.

## 2014-06-22 NOTE — Progress Notes (Signed)
Patient ID: KAROLINA ZAMOR, female   DOB: [birth year], 44 y.o.   MRN: [id number]  This chart was scribed for Jonnie Kind, MD by Donato Schultz, ED Scribe. This patient was seen in Room 3 and the patient's care was started at 9:30 AM.   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 Year 9 s/p tx.   Plan:  1. pap smear done, next pap due 1 year 2. return annually or prn 3    Annual mammogram advised Subjective:  JOYCELIN RADLOFF is a 44 y.o. female with a history of cervical cancer. No obstetric history on file. who presents for annual exam. No LMP recorded. Patient has had a hysterectomy.  She is complaining of a painful abscess between the crease of her left leg and crease of her vagina.  She states that the abscess became irritated last week while working and ruptured on its own.  Since then, it has increased in size.  She has applied coconut oil to the area with mild relief to her symptoms.  She has had half of her cervix removed due to her cervical cancer.  She has a history of emergency c-section.  Dr. Aldean Ast did her abdominal hysterectomy 9 years ago s/p cesarean delivery of twins..  She has blood work done every 6 months by Dr. Collene Mares.    The following portions of the patient's history were reviewed and updated as appropriate: allergies, current medications, past family history, past medical history, past social history, past surgical history and problem list. Past Medical History  Diagnosis Date  . Cancer   . Cervical cancer   . Hypertension     Past Surgical History  Procedure Laterality Date  . Abdominal hysterectomy    . Dilation and curettage of uterus    . Cesarean section       Current outpatient prescriptions:  .  Aspirin-Salicylamide-Caffeine (BC HEADACHE POWDER PO), Take 2 packets by mouth as needed., Disp: , Rfl:  .  calcium carbonate (TUMS - DOSED IN MG ELEMENTAL CALCIUM) 500 MG chewable tablet,  Chew 1 tablet by mouth every evening. As needed, Disp: , Rfl:  .  lisinopril-hydrochlorothiazide (PRINZIDE,ZESTORETIC) 20-25 MG per tablet, Take 1 tablet by mouth daily., Disp: , Rfl:   Review of Systems Constitutional: negative Gastrointestinal: negative Genitourinary: negative  Objective:  BP 106/80 mmHg  Ht 5\' 8"  (1.727 m)  Wt 227 lb (102.967 kg)  BMI 34.52 kg/m2   BMI: Body mass index is 34.52 kg/(m^2).  General Appearance: Alert, appropriate appearance for age. No acute distress HEENT: Grossly normal Neck / Thyroid:  Cardiovascular: RRR; normal S1, S2, no murmur Lungs: CTA bilaterally Back: No CVAT Breast Exam: No dimpling, nipple retraction or discharge. No masses or nodes., Normal to inspection, Normal breast tissue bilaterally and No masses or nodes.No dimpling, nipple retraction or discharge. Gastrointestinal: Soft, non-tender, no masses or organomegaly Pelvic Exam: Vulva and vagina appear normal. Bimanual exam reveals normal adnexa. External genitalia: normal general appearance Vaginal: normal mucosa without prolapse or lesions and normal without tenderness, induration or masses, well-healed surgical cuff Cervix: normal appearance Adnexa: no fullness, no masses Uterus: surgically absent Rectal: no masses Rectovaginal: not indicated Lymphatic Exam: Non-palpable nodes in neck, clavicular, axillary, or inguinal regions Skin: no rash or abnormalities Neurologic: Normal gait and speech, no tremor  Psychiatric: Alert and oriented, appropriate affect.  Urinalysis:Not done  Mallory Shirk. MD Pgr [phone] 9:29 AM

## 2014-06-22 NOTE — Addendum Note (Signed)
Addended by: Farley Ly on: 06/22/2014 09:57 AM   Modules accepted: Orders

## 2014-06-26 LAB — CYTOLOGY - PAP

## 2014-07-03 ENCOUNTER — Emergency Department (HOSPITAL_COMMUNITY)
Admission: EM | Admit: 2014-07-03 | Discharge: 2014-07-04 | Disposition: A | Discharge status: Home / Self Care | Payer: BLUE CROSS/BLUE SHIELD | Attending: Emergency Medicine | Admitting: Emergency Medicine

## 2014-07-03 ENCOUNTER — Encounter (HOSPITAL_COMMUNITY): Payer: Self-pay | Admitting: *Deleted

## 2014-07-03 DIAGNOSIS — K088 Other specified disorders of teeth and supporting structures: Secondary | ICD-10-CM | POA: Diagnosis not present

## 2014-07-03 DIAGNOSIS — Z720 Tobacco use: Secondary | ICD-10-CM | POA: Diagnosis not present

## 2014-07-03 DIAGNOSIS — Z79899 Other long term (current) drug therapy: Secondary | ICD-10-CM | POA: Insufficient documentation

## 2014-07-03 DIAGNOSIS — K029 Dental caries, unspecified: Secondary | ICD-10-CM

## 2014-07-03 DIAGNOSIS — Z8541 Personal history of malignant neoplasm of cervix uteri: Secondary | ICD-10-CM | POA: Diagnosis not present

## 2014-07-03 DIAGNOSIS — K0889 Other specified disorders of teeth and supporting structures: Secondary | ICD-10-CM

## 2014-07-03 DIAGNOSIS — I10 Essential (primary) hypertension: Secondary | ICD-10-CM | POA: Insufficient documentation

## 2014-07-03 NOTE — ED Notes (Signed)
Pt c/o tooth pain to left lower tooth; pt states she has a dentist appt at 11 am tomorrow morning

## 2014-07-03 NOTE — ED Provider Notes (Signed)
CSN: [id number]     Arrival date & time 07/03/14  2317 History  This chart was scribed for Rolland Porter, MD by Irene Pap, ED Scribe. This patient was seen in room APA12/APA12 and patient care was started at 11:54 PM.   Chief Complaint  Patient presents with  . Dental Pain   Patient is a 44 y.o. female presenting with tooth pain. The history is provided by the patient. No language interpreter was used.  Dental Pain HPI Comments: Sandra Rivers is a 44 y.o. female who presents to the Emergency Department complaining of left lower dental pain onset one day ago. She states the tooth has been broken for several months and that the pain has been present for a few months. She states she started having more pain about 2 weeks ago after she bit into a piece of pizza. She states that she has had a broken tooth for a while but has been told by her dentist to just monitor it. She states that it worsened tonight and woke her up after having gone to bed. She states that the pain is mostly in the center of the tooth and pressure to the gum area helps to relieve some of the pain. She states that she has taken 12 advil since 5:00 PM today. She states that she has also used Orajel triple action with no relief. She states that she has also used peroxide and salt water mouth rinses to no relief. She states that she has a dentist appointment at 11 AM tomorrow morning.   PCP Dr Gerarda Fraction  Past Medical History  Diagnosis Date  . Cancer   . Cervical cancer   . Hypertension    Past Surgical History  Procedure Laterality Date  . Abdominal hysterectomy    . Dilation and curettage of uterus    . Cesarean section     Family History  Problem Relation Age of Onset  . Multiple sclerosis Mother   . Hypertension Father   . Asperger's syndrome Daughter    History  Substance Use Topics  . Smoking status: Current Every Day Smoker -- 1.00 packs/day for 25 years    Types: Cigarettes  . Smokeless tobacco: Never Used  .  Alcohol Use: Yes     Comment: occ     OB History    No data available     Review of Systems  HENT: Positive for dental problem.   All other systems reviewed and are negative.  Allergies  Morphine and related; Bee venom; and Vicodin  Home Medications   Prior to Admission medications   Medication Sig Start Date End Date Taking? Authorizing Provider  Aspirin-Salicylamide-Caffeine (BC HEADACHE POWDER PO) Take 2 packets by mouth as needed.    Historical Provider, MD  calcium carbonate (TUMS - DOSED IN MG ELEMENTAL CALCIUM) 500 MG chewable tablet Chew 1 tablet by mouth every evening. As needed    Historical Provider, MD  lisinopril-hydrochlorothiazide (PRINZIDE,ZESTORETIC) 20-25 MG per tablet Take 1 tablet by mouth daily.    Historical Provider, MD   BP 128/71 mmHg  Pulse 92  Temp(Src) 97.9 F (36.6 C) (Oral)  Resp 18  Ht 5\' 8"  (1.727 m)  Wt 223 lb (101.152 kg)  BMI 33.91 kg/m2  SpO2 100%  Vital signs normal    Physical Exam  Constitutional: She is oriented to person, place, and time. She appears well-developed and well-nourished.  Non-toxic appearance. She does not appear ill. No distress.  HENT:  Head: Normocephalic and  atraumatic.  Right Ear: External ear normal.  Left Ear: External ear normal.  Nose: Nose normal. No mucosal edema or rhinorrhea.  Mouth/Throat: Oropharynx is clear and moist and mucous membranes are normal. No dental abscesses or uvula swelling.    No facial swelling; no lymphadenopathy  Eyes: Conjunctivae and EOM are normal. Pupils are equal, round, and reactive to light.  Neck: Normal range of motion and full passive range of motion without pain. Neck supple.  Pulmonary/Chest: Effort normal. No respiratory distress. She has no rhonchi. She exhibits no crepitus.  Abdominal: Normal appearance.  Musculoskeletal: Normal range of motion.  Moves all extremities well.   Neurological: She is alert and oriented to person, place, and time. She has normal  strength. No cranial nerve deficit.  Skin: Skin is warm, dry and intact. No rash noted. No erythema. No pallor.  Psychiatric: She has a normal mood and affect. Her speech is normal and behavior is normal. Her mood appears not anxious.  Nursing note and vitals reviewed.   ED Course  Procedures (including critical care time) DIAGNOSTIC STUDIES: Oxygen Saturation is 100% on room air, normal by my interpretation.    COORDINATION OF CARE: 11:59 PM-Discussed treatment plan which includes numbing of tooth with pt at bedside and pt agreed to plan. Patient states she does not want any pain medications. We also discussed that she should not be taking excessive amounts of medication even if it is over-the-counter. I am uncomfortable giving this patient narcotics because she is very irresponsible and how she takes her medication. Patient also states she does not want any narcotic pain medication.  I was going to put cement on her cavity, but we only had the powder, not the liquid used to mix it with. PA Idol did a dental block on this patient.   Labs Review Labs Reviewed - No data to display  Imaging Review No results found.  EKG Interpretation No orders found for this or any previous visit.  Medications - No data to display  MDM  patient presents with a large dental caries of tooth #19 that has been present for several months. She reports worsening pain over the past several months that got worse 2 weeks ago and then acutely worse tonight. She also took 12 Advil since 5 PM. We had a discussion about not taking excessive amounts of medications and her risk of causing kidney failure or stomach ulcers by doing this.    Final diagnoses:  Toothache  Dental caries   Plan discharge  Rolland Porter, MD, FACEP   I personally performed the services described in this documentation, which was scribed in my presence. The recorded information has been reviewed and considered.  Rolland Porter, MD,  Barbette Or, MD 07/04/14 [phone]

## 2014-07-04 NOTE — Discharge Instructions (Signed)
Keep your appointment with your dentist later today. You can take acetaminophen 1000 mg every 6 hrs for pain. DO NOT TAKE MORE THAN THAT!!!!   Dental Caries Dental caries is tooth decay. This decay can cause a hole in teeth (cavity) that can get bigger and deeper over time. HOME CARE  Brush and floss your teeth. Do this at least two times a day.  Use a fluoride toothpaste.  Use a mouth rinse if told by your dentist or doctor.  Eat less sugary and starchy foods. Drink less sugary drinks.  Avoid snacking often on sugary and starchy foods. Avoid sipping often on sugary drinks.  Keep regular checkups and cleanings with your dentist.  Use fluoride supplements if told by your dentist or doctor.  Allow fluoride to be applied to teeth if told by your dentist or doctor. Document Released: 11/27/2007 Document Revised: 07/04/2013 Document Reviewed: 02/20/2012 [facility] Patient Information 2015 [place]. This information is not intended to replace advice given to you by your health care provider. Make sure you discuss any questions you have with your health care provider.

## 2014-07-04 NOTE — ED Provider Notes (Signed)
Pt with large painful dental cavity, scheduled to see her dentist tomorrow.  Asked to perform dental block by Dr. Tomi Bamberger.  Used control syringe using bupivacaine 0.5%  1 cc applied to left lower mandible nerve block.  Pain relief achieved, pt tolerated procedure well without complication.  Evalee Jefferson, PA-C 07/04/14 [id number]  Rolland Porter, MD 07/04/14 [phone]

## 2015-05-13 ENCOUNTER — Encounter (HOSPITAL_COMMUNITY): Payer: Self-pay | Admitting: Emergency Medicine

## 2015-05-13 ENCOUNTER — Emergency Department (HOSPITAL_COMMUNITY)
Admission: EM | Admit: 2015-05-13 | Discharge: 2015-05-13 | Disposition: A | Discharge status: Home / Self Care | Payer: BLUE CROSS/BLUE SHIELD | Attending: Emergency Medicine | Admitting: Emergency Medicine

## 2015-05-13 DIAGNOSIS — I10 Essential (primary) hypertension: Secondary | ICD-10-CM | POA: Diagnosis not present

## 2015-05-13 DIAGNOSIS — F17210 Nicotine dependence, cigarettes, uncomplicated: Secondary | ICD-10-CM | POA: Diagnosis not present

## 2015-05-13 DIAGNOSIS — Z7982 Long term (current) use of aspirin: Secondary | ICD-10-CM | POA: Insufficient documentation

## 2015-05-13 DIAGNOSIS — Z8541 Personal history of malignant neoplasm of cervix uteri: Secondary | ICD-10-CM | POA: Insufficient documentation

## 2015-05-13 DIAGNOSIS — J020 Streptococcal pharyngitis: Secondary | ICD-10-CM | POA: Diagnosis not present

## 2015-05-13 MED ORDER — DEXAMETHASONE SODIUM PHOSPHATE 10 MG/ML IJ SOLN
10.0000 mg | Freq: Once | INTRAMUSCULAR | Status: AC
  Administered 2015-05-13: 10 mg via INTRAMUSCULAR
  Filled 2015-05-13: qty 1

## 2015-05-13 MED ORDER — LIDOCAINE VISCOUS 2 % MT SOLN: 15.0000 mL | Freq: Four times a day (QID) | OROMUCOSAL | Status: DC | PRN

## 2015-05-13 MED ORDER — IBUPROFEN 800 MG PO TABS
800.0000 mg | ORAL_TABLET | Freq: Once | ORAL | Status: AC
  Administered 2015-05-13: 800 mg via ORAL
  Filled 2015-05-13: qty 1

## 2015-05-13 MED ORDER — PENICILLIN G BENZATHINE 1200000 UNIT/2ML IM SUSP
1.2000 10*6.[IU] | Freq: Once | INTRAMUSCULAR | Status: AC
  Administered 2015-05-13: 1.2 10*6.[IU] via INTRAMUSCULAR
  Filled 2015-05-13: qty 2

## 2015-05-13 MED ORDER — LIDOCAINE VISCOUS 2 % MT SOLN
15.0000 mL | Freq: Once | OROMUCOSAL | Status: AC
  Administered 2015-05-13: 15 mL via OROMUCOSAL
  Filled 2015-05-13: qty 15

## 2015-05-13 NOTE — ED Notes (Signed)
MD at bedside. 

## 2015-05-13 NOTE — ED Provider Notes (Signed)
CSN: [id number]     Arrival date & time 05/13/15  0810 History  By signing my name below, I, Jolayne Panther, attest that this documentation has been prepared under the direction and in the presence of Merrily Pew, MD. Electronically Signed: Jolayne Panther, Scribe. 05/13/2015. 8:35 AM.   Chief Complaint  Patient presents with  . Sore Throat   The history is provided by the patient. No language interpreter was used.   HPI Comments: Sandra Rivers is a 45 y.o. female who presents to the Emergency Department complaining of sudden onset of generalized body aches about three days ago followed by onset of a sore throat which began yesterday. She states that her body aches have resolved but currently reports associated voice change, pain with swallowing, and an intermittent fever measured at 107 at its highest. She has tried gargling listerine and has tried taking flu medication with no relief. She also notes that her daughter had a fever of 103 this past week and reports recent, multiple sick contacts at work as well. Pt denies trouble swallowing, rash, nausea, vomiting, cough, nasal drainage, recent travel, and hx of similar symptoms.   Past Medical History  Diagnosis Date  . Cancer ([place])   . Cervical cancer ([place])   . Hypertension    Past Surgical History  Procedure Laterality Date  . Abdominal hysterectomy    . Dilation and curettage of uterus    . Cesarean section     Family History  Problem Relation Age of Onset  . Multiple sclerosis Mother   . Hypertension Father   . Asperger's syndrome Daughter    Social History  Substance Use Topics  . Smoking status: Current Every Day Smoker -- 1.00 packs/day for 25 years    Types: Cigarettes  . Smokeless tobacco: Never Used  . Alcohol Use: Yes     Comment: occ   OB History    Gravida Para Term Preterm AB TAB SAB Ectopic Multiple Living   3 3 1 2      3      Review of Systems  Constitutional: Positive for fever.  HENT:  Positive for sore throat. Negative for postnasal drip and trouble swallowing.   Respiratory: Negative for cough.   Gastrointestinal: Negative for nausea and vomiting.  Skin: Negative for rash.  All other systems reviewed and are negative.  Allergies  Morphine and related; Bee venom; and Vicodin  Home Medications   Prior to Admission medications   Medication Sig Start Date End Date Taking? Authorizing Provider  Aspirin-Salicylamide-Caffeine (BC HEADACHE POWDER PO) Take 2 packets by mouth as needed.    Historical Provider, MD  calcium carbonate (TUMS - DOSED IN MG ELEMENTAL CALCIUM) 500 MG chewable tablet Chew 1 tablet by mouth every evening. As needed    Historical Provider, MD  lidocaine (XYLOCAINE) 2 % solution Use as directed 15 mLs in the mouth or throat every 6 (six) hours as needed for mouth pain. 05/13/15   Merrily Pew, MD  lisinopril-hydrochlorothiazide (PRINZIDE,ZESTORETIC) 20-25 MG per tablet Take 1 tablet by mouth daily.    Historical Provider, MD   BP 135/92 mmHg  Pulse 92  Temp(Src) 99 F (37.2 C) (Oral)  Resp 16  Ht 5\' 8"  (1.727 m)  Wt 219 lb (99.338 kg)  BMI 33.31 kg/m2  SpO2 100% Physical Exam  Constitutional: She is oriented to person, place, and time. She appears well-developed and well-nourished. No distress.  HENT:  Head: Normocephalic and atraumatic.  Tonsillar exudates  Edema and erythema post pharyngeal  Tender lymphadenopathy and bilateral anterior cervical chains   Eyes: Conjunctivae are normal.  Cardiovascular: Normal rate, regular rhythm and normal heart sounds.   Pulmonary/Chest: Effort normal and breath sounds normal. No respiratory distress. She has no wheezes.  Abdominal: Soft. She exhibits no distension. There is no tenderness.  Neurological: She is alert and oriented to person, place, and time.  Skin: Skin is warm and dry.  Psychiatric: She has a normal mood and affect.  Nursing note and vitals reviewed.  ED Course  Procedures  DIAGNOSTIC  STUDIES:    Oxygen Saturation is 100% on RA, normal by my interpretation.  COORDINATION OF CARE:  8:30 AM Will administer antibiotics and shot of steroids in the ED. Will prescribe viscous lidocaine. Discussed treatment plan with pt at bedside and pt agreed to plan.   MDM   Final diagnoses:  Strep throat    4/4 centor criteria for strep pharyngitis, will tx with one time PCN, decadron, toradol and viscous lidocaine. No e/o abscess, RPA or other complications from same.   I have personally and contemperaneously reviewed labs and imaging and used in my decision making as above.   A medical screening exam was performed and I feel the patient has had an appropriate workup for their chief complaint at this time and likelihood of emergent condition existing is low. Their vital signs are stable. They have been counseled on decision, discharge, follow up and which symptoms necessitate immediate return to the emergency department.  They verbally stated understanding and agreement with plan and discharged in stable condition.   I personally performed the services described in this documentation, which was scribed in my presence. The recorded information has been reviewed and is accurate.    Merrily Pew, MD 05/13/15 [phone]

## 2015-05-13 NOTE — ED Notes (Signed)
Patient c/o sore throat with intermittent low grade fevers. Patient states started on Thursday a= with body aches, which are now gone but she still has headache and throat is progressively getting worse.  Per patient painful to swallow, but no difficulty swallowing or breathing. Patient reports taking tylenol, motrin, cold and flu, and alka seltzer with no relief.

## 2016-05-17 ENCOUNTER — Emergency Department (HOSPITAL_COMMUNITY)
Admission: EM | Admit: 2016-05-17 | Discharge: 2016-05-17 | Disposition: A | Discharge status: Home / Self Care | Payer: BLUE CROSS/BLUE SHIELD | Attending: Emergency Medicine | Admitting: Emergency Medicine

## 2016-05-17 ENCOUNTER — Encounter (HOSPITAL_COMMUNITY): Payer: Self-pay | Admitting: Emergency Medicine

## 2016-05-17 DIAGNOSIS — Z79899 Other long term (current) drug therapy: Secondary | ICD-10-CM | POA: Insufficient documentation

## 2016-05-17 DIAGNOSIS — I10 Essential (primary) hypertension: Secondary | ICD-10-CM | POA: Insufficient documentation

## 2016-05-17 DIAGNOSIS — B955 Unspecified streptococcus as the cause of diseases classified elsewhere: Secondary | ICD-10-CM | POA: Insufficient documentation

## 2016-05-17 DIAGNOSIS — J028 Acute pharyngitis due to other specified organisms: Secondary | ICD-10-CM | POA: Diagnosis not present

## 2016-05-17 DIAGNOSIS — J069 Acute upper respiratory infection, unspecified: Secondary | ICD-10-CM | POA: Diagnosis not present

## 2016-05-17 DIAGNOSIS — R0981 Nasal congestion: Secondary | ICD-10-CM | POA: Diagnosis present

## 2016-05-17 DIAGNOSIS — Z8541 Personal history of malignant neoplasm of cervix uteri: Secondary | ICD-10-CM | POA: Diagnosis not present

## 2016-05-17 DIAGNOSIS — F17210 Nicotine dependence, cigarettes, uncomplicated: Secondary | ICD-10-CM | POA: Insufficient documentation

## 2016-05-17 LAB — RAPID STREP SCREEN (MED CTR MEBANE ONLY): STREPTOCOCCUS, GROUP A SCREEN (DIRECT): NEGATIVE

## 2016-05-17 MED ORDER — IBUPROFEN 100 MG/5ML PO SUSP
400.0000 mg | Freq: Once | ORAL | Status: AC
  Administered 2016-05-17: 400 mg via ORAL
  Filled 2016-05-17: qty 20

## 2016-05-17 MED ORDER — PENICILLIN G BENZATHINE 1200000 UNIT/2ML IM SUSP
1.2000 10*6.[IU] | Freq: Once | INTRAMUSCULAR | Status: AC
  Administered 2016-05-17: 1.2 10*6.[IU] via INTRAMUSCULAR
  Filled 2016-05-17: qty 2

## 2016-05-17 NOTE — Discharge Instructions (Signed)
Please use Chloraseptic Spray and some water gargles daily. Use 600 mg of ibuprofen every 6 hours for pain and temperature elevations. You were treated in the emergency room with intramuscular Bicillin. Please see your primary physician for additional evaluation if not improving. Please wash hands frequently. Please use a mask until symptoms have resolved.

## 2016-05-17 NOTE — ED Provider Notes (Signed)
[facility] DEPT Provider Note   CSN: [id number] Arrival date & time: 05/17/16  1114     History   Chief Complaint Chief Complaint  Patient presents with  . Sore Throat    HPI Sandra Rivers is a 46 y.o. female.  The history is provided by the patient.  URI   This is a new problem. The current episode started yesterday. The problem has been gradually worsening. The maximum temperature recorded prior to her arrival was 100 to 100.9 F. Associated symptoms include congestion, sore throat and neck pain. Pertinent negatives include no abdominal pain, no diarrhea, no nausea, no vomiting, no sinus pain, no sneezing, no cough and no wheezing. She has tried other medications and NSAIDs for the symptoms.    Past Medical History:  Diagnosis Date  . Cancer ([place])   . Cervical cancer ([place])   . Hypertension     There are no active problems to display for this patient.   Past Surgical History:  Procedure Laterality Date  . ABDOMINAL HYSTERECTOMY    . CESAREAN SECTION    . DILATION AND CURETTAGE OF UTERUS      OB History    Gravida Para Term Preterm AB Living   3 3 1 2   3    SAB TAB Ectopic Multiple Live Births                   Home Medications    Prior to Admission medications   Medication Sig Start Date End Date Taking? Authorizing Provider  Aspirin-Salicylamide-Caffeine (BC HEADACHE POWDER PO) Take 2 packets by mouth as needed.    Historical Provider, MD  calcium carbonate (TUMS - DOSED IN MG ELEMENTAL CALCIUM) 500 MG chewable tablet Chew 1 tablet by mouth every evening. As needed    Historical Provider, MD  lidocaine (XYLOCAINE) 2 % solution Use as directed 15 mLs in the mouth or throat every 6 (six) hours as needed for mouth pain. 05/13/15   Merrily Pew, MD  lisinopril-hydrochlorothiazide (PRINZIDE,ZESTORETIC) 20-25 MG per tablet Take 1 tablet by mouth daily.    Historical Provider, MD    Family History Family History  Problem Relation Age of Onset  . Multiple  sclerosis Mother   . Hypertension Father   . Asperger's syndrome Daughter     Social History Social History  Substance Use Topics  . Smoking status: Current Every Day Smoker    Packs/day: 1.00    Years: 25.00    Types: Cigarettes  . Smokeless tobacco: Never Used  . Alcohol use Yes     Comment: occ     Allergies   Morphine and related; Bee venom; and Vicodin [hydrocodone-acetaminophen]   Review of Systems Review of Systems  HENT: Positive for congestion and sore throat. Negative for sinus pain and sneezing.   Respiratory: Negative for cough and wheezing.   Gastrointestinal: Negative for abdominal pain, diarrhea, nausea and vomiting.  Musculoskeletal: Positive for neck pain.  All other systems reviewed and are negative.    Physical Exam Updated Vital Signs BP 128/71 (BP Location: Left Arm)   Pulse 100   Temp 100 F (37.8 C) (Oral)   Resp 16   Ht 5\' 8"  (1.727 m)   Wt 96.2 kg   SpO2 99%   BMI 32.23 kg/m   Physical Exam  Constitutional: She is oriented to person, place, and time. She appears well-developed and well-nourished.  Non-toxic appearance.  HENT:  Head: Normocephalic.  Right Ear: Tympanic membrane and external ear  normal.  Left Ear: Tympanic membrane and external ear normal.  Mouth/Throat: Oropharyngeal exudate and posterior oropharyngeal erythema present. No tonsillar abscesses.  Eyes: EOM and lids are normal. Pupils are equal, round, and reactive to light.  Neck: Normal range of motion. Neck supple. Carotid bruit is not present.  Cardiovascular: Normal rate, regular rhythm, normal heart sounds, intact distal pulses and normal pulses.   Pulmonary/Chest: Breath sounds normal. No respiratory distress.  Abdominal: Soft. Bowel sounds are normal. There is no tenderness. There is no guarding.  Musculoskeletal: Normal range of motion.  Lymphadenopathy:       Head (right side): No submandibular adenopathy present.       Head (left side): No submandibular  adenopathy present.    She has no cervical adenopathy.  Neurological: She is alert and oriented to person, place, and time. She has normal strength. No cranial nerve deficit or sensory deficit.  Skin: Skin is warm and dry.  Psychiatric: She has a normal mood and affect. Her speech is normal.  Nursing note and vitals reviewed.    ED Treatments / Results  Labs (all labs ordered are listed, but only abnormal results are displayed) Labs Reviewed  RAPID STREP SCREEN (NOT AT [facility])  CULTURE, GROUP A STREP [facility])    EKG  EKG Interpretation None       Radiology No results found.  Procedures Procedures (including critical care time)  Medications Ordered in ED Medications - No data to display   Initial Impression / Assessment and Plan / ED Course  I have reviewed the triage vital signs and the nursing notes.  Pertinent labs & imaging results that were available during my care of the patient were reviewed by me and considered in my medical decision making (see chart for details).     **I have reviewed nursing notes, vital signs, and all appropriate lab and imaging results for this patient.*  Final Clinical Impressions(s) / ED Diagnoses MDM Temperature is 100. Heart rate is also elevated at 100. Pulse oximetry is 99% on room air. The examination reveals exudate of the posterior pharynx. There is tachycardia present.  Patient requests treatment with Bicillin. 1.2 mean use Bicillin ordered for the patient. Patient will use a water gargles and ibuprofen for soreness. We discussed the importance of good handwashing, and using a mask until symptoms have resolved.    Final diagnoses:  None    New Prescriptions New Prescriptions   No medications on file     Lily Kocher, PA-C 05/17/16 1255    Milton Ferguson, MD 05/17/16 1506

## 2016-05-17 NOTE — ED Triage Notes (Signed)
Pt reports sore throat and pain with swallowing x 1 day.

## 2016-05-20 LAB — CULTURE, GROUP A STREP (THRC)

## 2016-12-23 ENCOUNTER — Emergency Department (HOSPITAL_COMMUNITY): Payer: BLUE CROSS/BLUE SHIELD

## 2016-12-23 ENCOUNTER — Encounter (HOSPITAL_COMMUNITY): Payer: Self-pay | Admitting: *Deleted

## 2016-12-23 ENCOUNTER — Emergency Department (HOSPITAL_COMMUNITY)
Admission: EM | Admit: 2016-12-23 | Discharge: 2016-12-23 | Disposition: A | Discharge status: Home / Self Care | Payer: BLUE CROSS/BLUE SHIELD | Attending: Emergency Medicine | Admitting: Emergency Medicine

## 2016-12-23 DIAGNOSIS — Z5321 Procedure and treatment not carried out due to patient leaving prior to being seen by health care provider: Secondary | ICD-10-CM | POA: Diagnosis not present

## 2016-12-23 DIAGNOSIS — M79641 Pain in right hand: Secondary | ICD-10-CM | POA: Diagnosis present

## 2016-12-23 NOTE — ED Triage Notes (Signed)
Pt c/o pain and swelling to right thumb area for over a week, worse today, unsure of any injury.

## 2019-01-18 IMAGING — DX DG HAND COMPLETE 3+V*R*
3 series · 3 of 3 positions shown · non-contrast
Comparison: None.

CLINICAL DATA: 45-year-old female with pain and swelling of the
right hand and right thumb. No known injury.

EXAM:
RIGHT HAND - COMPLETE 3+ VIEW

[hand pa]
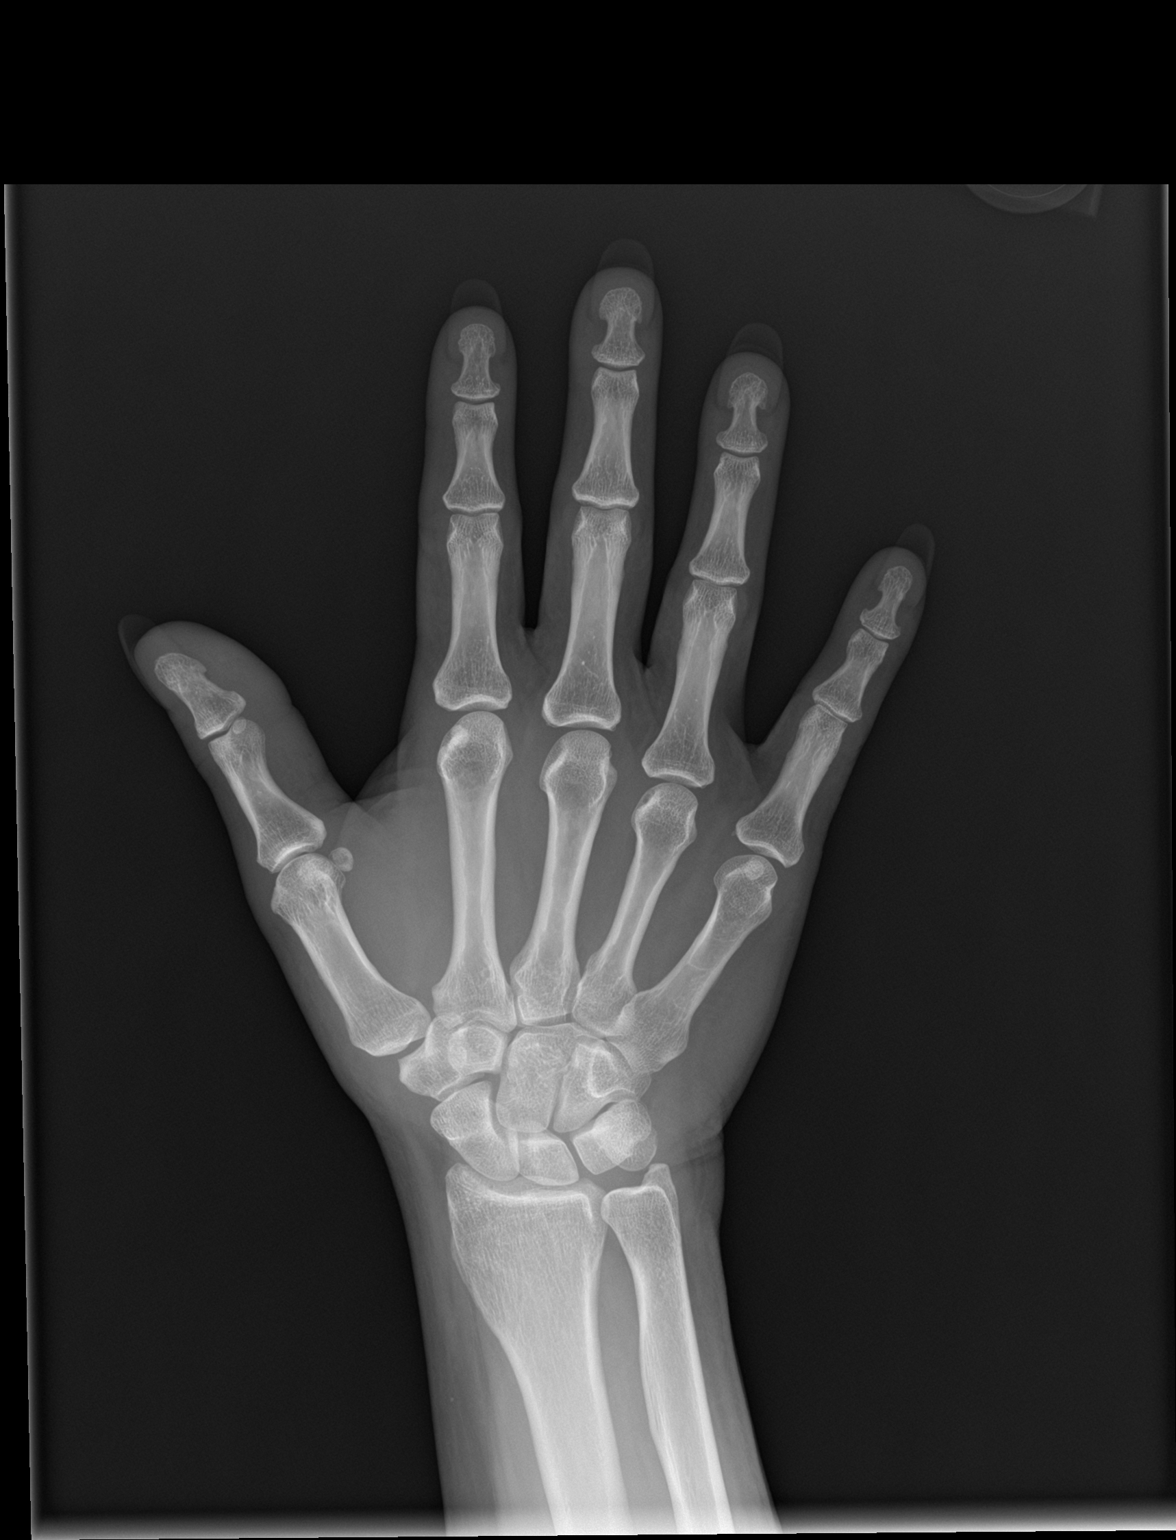

[hand obl]
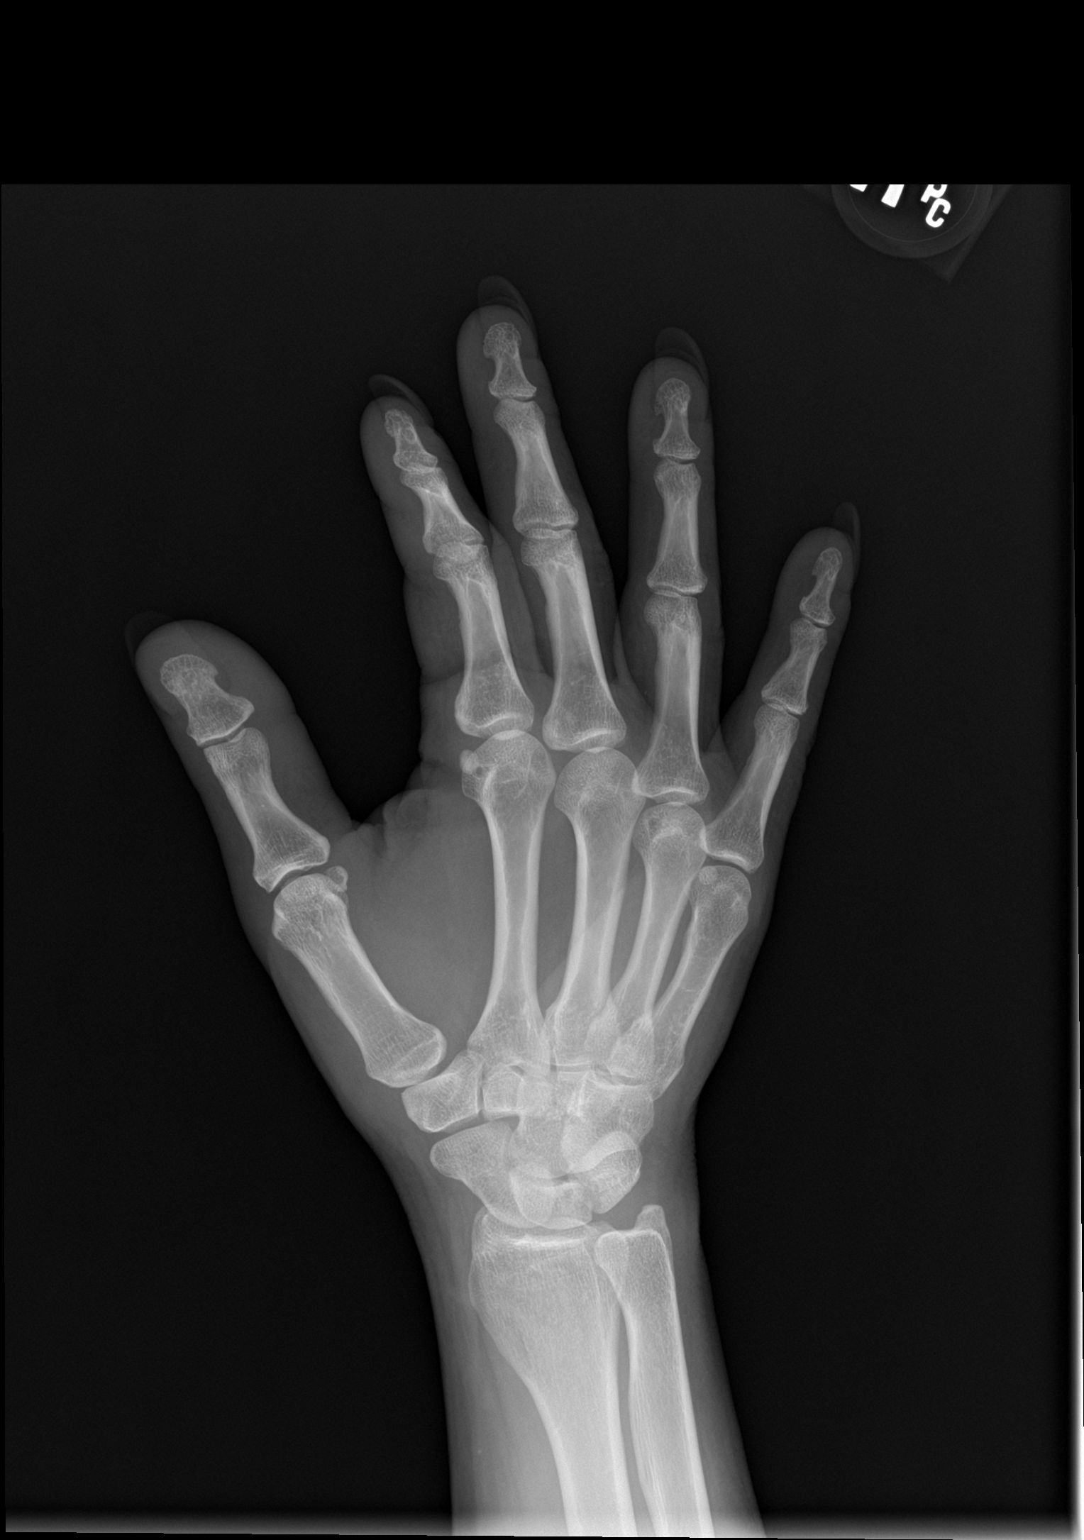

[hand lat]
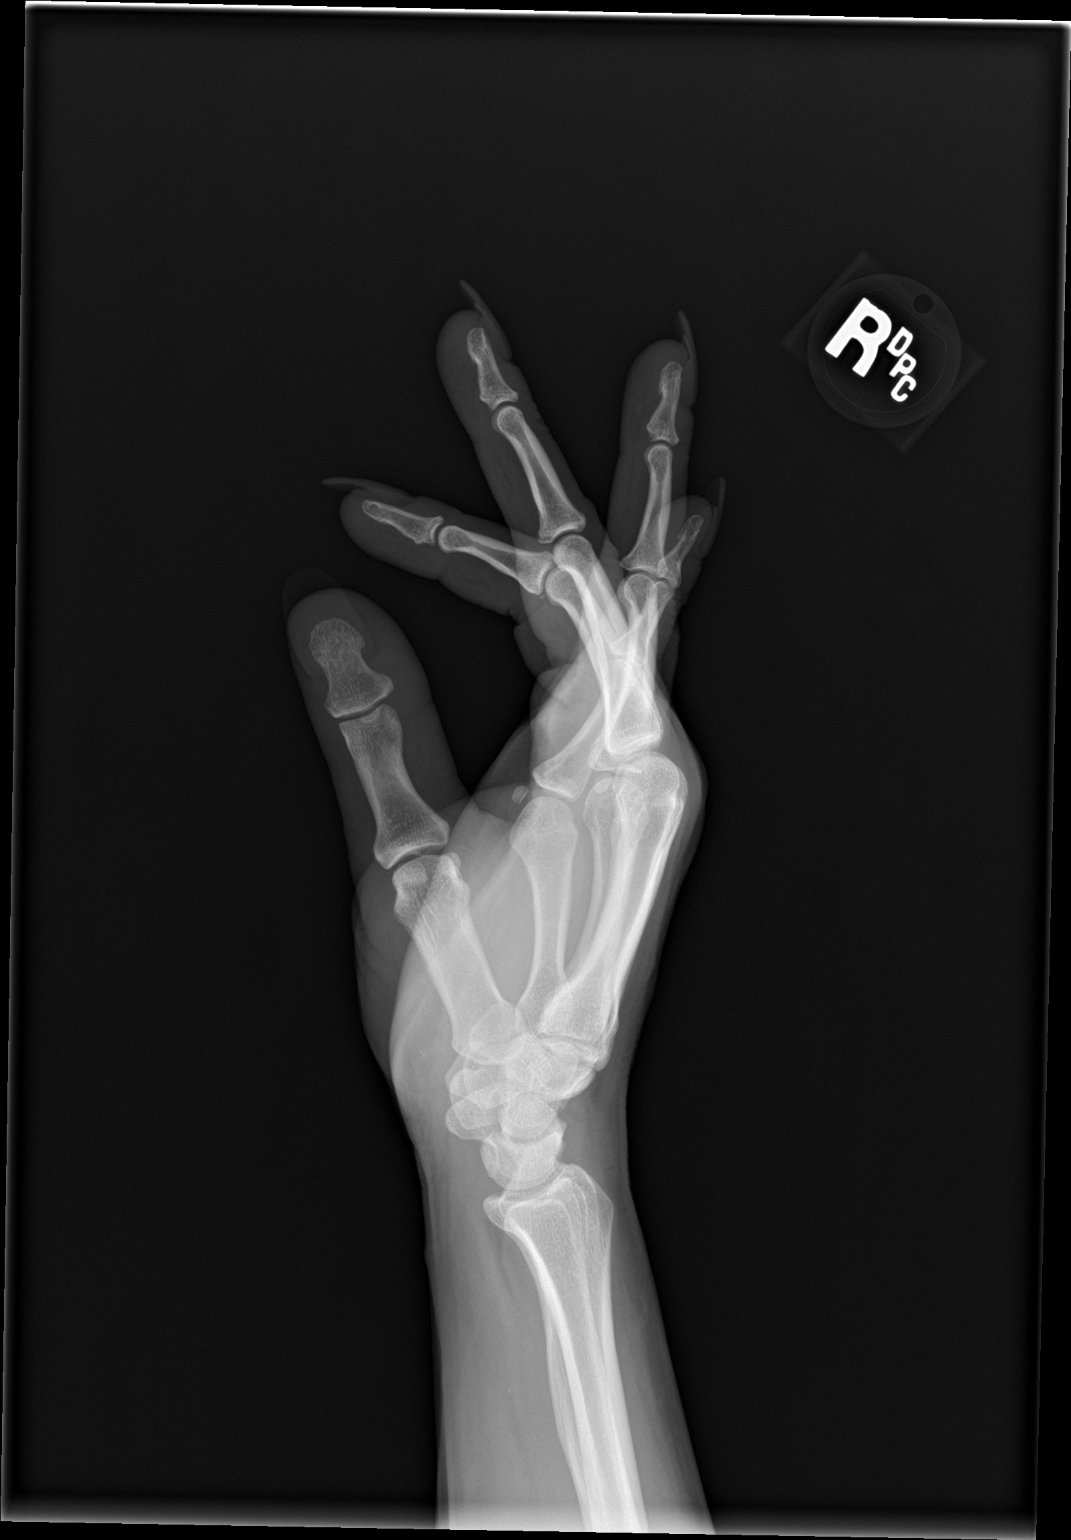

[3 of 3 positions shown; findings below may reference images not displayed]

FINDINGS: There is no acute fracture or dislocation. The bones are well
mineralized. No arthritic changes. Mild soft tissue swelling of the
thumb. No radiopaque foreign object or soft tissue gas.
IMPRESSION: No acute osseous pathology.
